# Patient Record
Sex: Male | Born: 1970 | Hispanic: Refuse to answer | Smoking: Current every day smoker
Health system: Southern US, Community
[De-identification: ages and names within clinical notes are randomized; demographics above are authoritative.]

## PROBLEM LIST (undated history)

## (undated) DIAGNOSIS — G8929 Other chronic pain: Secondary | ICD-10-CM

## (undated) DIAGNOSIS — F1120 Opioid dependence, uncomplicated: Secondary | ICD-10-CM

## (undated) DIAGNOSIS — N200 Calculus of kidney: Secondary | ICD-10-CM

## (undated) DIAGNOSIS — L4050 Arthropathic psoriasis, unspecified: Secondary | ICD-10-CM

## (undated) HISTORY — DX: Calculus of kidney: N20.0

---

## 1999-07-25 ENCOUNTER — Emergency Department (HOSPITAL_COMMUNITY): Admission: EM | Admit: 1999-07-25 | Discharge: 1999-07-25 | Payer: Self-pay | Admitting: Emergency Medicine

## 1999-07-26 ENCOUNTER — Encounter: Payer: Self-pay | Admitting: Emergency Medicine

## 1999-07-26 ENCOUNTER — Emergency Department (HOSPITAL_COMMUNITY): Admission: EM | Admit: 1999-07-26 | Discharge: 1999-07-26 | Payer: Self-pay | Admitting: Emergency Medicine

## 2005-06-27 ENCOUNTER — Emergency Department (HOSPITAL_COMMUNITY): Admission: EM | Admit: 2005-06-27 | Discharge: 2005-06-28 | Payer: Self-pay | Admitting: Emergency Medicine

## 2009-06-07 ENCOUNTER — Emergency Department (HOSPITAL_COMMUNITY): Admission: EM | Admit: 2009-06-07 | Discharge: 2009-06-07 | Payer: Self-pay | Admitting: Emergency Medicine

## 2009-08-03 ENCOUNTER — Emergency Department (HOSPITAL_COMMUNITY): Admission: EM | Admit: 2009-08-03 | Discharge: 2009-08-03 | Payer: Self-pay | Admitting: Emergency Medicine

## 2010-03-23 ENCOUNTER — Emergency Department (HOSPITAL_COMMUNITY): Admission: EM | Admit: 2010-03-23 | Discharge: 2010-03-23 | Payer: Self-pay | Admitting: Emergency Medicine

## 2010-03-24 ENCOUNTER — Emergency Department (HOSPITAL_COMMUNITY): Admission: EM | Admit: 2010-03-24 | Discharge: 2010-03-24 | Payer: Self-pay | Admitting: Emergency Medicine

## 2010-05-03 ENCOUNTER — Emergency Department (HOSPITAL_COMMUNITY): Admission: EM | Admit: 2010-05-03 | Discharge: 2010-05-03 | Payer: Self-pay | Admitting: Emergency Medicine

## 2011-03-28 LAB — DIFFERENTIAL
Basophils Absolute: 0 10*3/uL (ref 0.0–0.1)
Basophils Relative: 1 % (ref 0–1)
Eosinophils Absolute: 0.2 10*3/uL (ref 0.0–0.7)
Eosinophils Relative: 2 % (ref 0–5)
Lymphocytes Relative: 24 % (ref 12–46)
Lymphs Abs: 2.3 10*3/uL (ref 0.7–4.0)
Monocytes Absolute: 0.7 10*3/uL (ref 0.1–1.0)
Monocytes Relative: 7 % (ref 3–12)
Neutro Abs: 6.4 10*3/uL (ref 1.7–7.7)
Neutrophils Relative %: 67 % (ref 43–77)

## 2011-03-28 LAB — POCT I-STAT, CHEM 8
BUN: 15 mg/dL (ref 6–23)
Calcium, Ion: 1.29 mmol/L (ref 1.12–1.32)
Chloride: 105 meq/L (ref 96–112)
Creatinine, Ser: 1.2 mg/dL (ref 0.4–1.5)
Glucose, Bld: 108 mg/dL — ABNORMAL HIGH (ref 70–99)
HCT: 50 % (ref 39.0–52.0)
Hemoglobin: 17 g/dL (ref 13.0–17.0)
Potassium: 4.3 meq/L (ref 3.5–5.1)
Sodium: 139 meq/L (ref 135–145)
TCO2: 27 mmol/L (ref 0–100)

## 2011-03-28 LAB — CBC
HCT: 48.3 % (ref 39.0–52.0)
Hemoglobin: 16.3 g/dL (ref 13.0–17.0)
MCHC: 33.7 g/dL (ref 30.0–36.0)
MCV: 91.4 fL (ref 78.0–100.0)
Platelets: 285 10*3/uL (ref 150–400)
RBC: 5.29 MIL/uL (ref 4.22–5.81)
RDW: 13.6 % (ref 11.5–15.5)
WBC: 9.6 10*3/uL (ref 4.0–10.5)

## 2011-04-14 ENCOUNTER — Emergency Department (INDEPENDENT_AMBULATORY_CARE_PROVIDER_SITE_OTHER): Payer: Managed Care, Other (non HMO)

## 2011-04-14 ENCOUNTER — Emergency Department (HOSPITAL_BASED_OUTPATIENT_CLINIC_OR_DEPARTMENT_OTHER)
Admission: EM | Admit: 2011-04-14 | Discharge: 2011-04-14 | Disposition: A | Discharge status: Home / Self Care | Payer: Managed Care, Other (non HMO) | Attending: Emergency Medicine | Admitting: Emergency Medicine

## 2011-04-14 DIAGNOSIS — M545 Low back pain, unspecified: Secondary | ICD-10-CM

## 2011-04-14 DIAGNOSIS — N508 Other specified disorders of male genital organs: Secondary | ICD-10-CM | POA: Insufficient documentation

## 2011-04-14 DIAGNOSIS — L408 Other psoriasis: Secondary | ICD-10-CM | POA: Insufficient documentation

## 2011-04-14 DIAGNOSIS — R51 Headache: Secondary | ICD-10-CM | POA: Insufficient documentation

## 2011-04-14 DIAGNOSIS — N433 Hydrocele, unspecified: Secondary | ICD-10-CM | POA: Insufficient documentation

## 2011-04-14 DIAGNOSIS — M47817 Spondylosis without myelopathy or radiculopathy, lumbosacral region: Secondary | ICD-10-CM | POA: Insufficient documentation

## 2012-03-13 ENCOUNTER — Emergency Department (HOSPITAL_COMMUNITY): Payer: Self-pay

## 2012-03-13 ENCOUNTER — Emergency Department (HOSPITAL_COMMUNITY)
Admission: EM | Admit: 2012-03-13 | Discharge: 2012-03-13 | Disposition: A | Discharge status: Home / Self Care | Payer: Self-pay | Attending: Emergency Medicine | Admitting: Emergency Medicine

## 2012-03-13 ENCOUNTER — Encounter (HOSPITAL_COMMUNITY): Payer: Self-pay | Admitting: *Deleted

## 2012-03-13 DIAGNOSIS — R0682 Tachypnea, not elsewhere classified: Secondary | ICD-10-CM | POA: Insufficient documentation

## 2012-03-13 DIAGNOSIS — R059 Cough, unspecified: Secondary | ICD-10-CM | POA: Insufficient documentation

## 2012-03-13 DIAGNOSIS — R509 Fever, unspecified: Secondary | ICD-10-CM | POA: Insufficient documentation

## 2012-03-13 DIAGNOSIS — IMO0001 Reserved for inherently not codable concepts without codable children: Secondary | ICD-10-CM | POA: Insufficient documentation

## 2012-03-13 DIAGNOSIS — R05 Cough: Secondary | ICD-10-CM | POA: Insufficient documentation

## 2012-03-13 DIAGNOSIS — R0602 Shortness of breath: Secondary | ICD-10-CM | POA: Insufficient documentation

## 2012-03-13 DIAGNOSIS — J189 Pneumonia, unspecified organism: Secondary | ICD-10-CM | POA: Insufficient documentation

## 2012-03-13 DIAGNOSIS — J3489 Other specified disorders of nose and nasal sinuses: Secondary | ICD-10-CM | POA: Insufficient documentation

## 2012-03-13 MED ORDER — AZITHROMYCIN 250 MG PO TABS: 250.0000 mg | ORAL_TABLET | Freq: Every day | ORAL | Status: AC

## 2012-03-13 MED ORDER — HYDROCOD POLST-CHLORPHEN POLST 10-8 MG/5ML PO LQCR: 5.0000 mL | Freq: Two times a day (BID) | ORAL | Status: DC | PRN

## 2012-03-13 MED ORDER — AMOXICILLIN 500 MG PO CAPS: 1000.0000 mg | ORAL_CAPSULE | Freq: Three times a day (TID) | ORAL | Status: AC

## 2012-03-13 MED ORDER — ALBUTEROL SULFATE (5 MG/ML) 0.5% IN NEBU
5.0000 mg | INHALATION_SOLUTION | Freq: Once | RESPIRATORY_TRACT | Status: AC
  Administered 2012-03-13: 5 mg via RESPIRATORY_TRACT
  Filled 2012-03-13: qty 1

## 2012-03-13 MED ORDER — ACETAMINOPHEN 500 MG PO TABS
1000.0000 mg | ORAL_TABLET | Freq: Once | ORAL | Status: AC
  Administered 2012-03-13: 1000 mg via ORAL
  Filled 2012-03-13: qty 2

## 2012-03-13 MED ORDER — ALBUTEROL SULFATE HFA 108 (90 BASE) MCG/ACT IN AERS
2.0000 | INHALATION_SPRAY | Freq: Four times a day (QID) | RESPIRATORY_TRACT | Status: DC
  Administered 2012-03-13 (×2): 2 via RESPIRATORY_TRACT
  Filled 2012-03-13: qty 6.7

## 2012-03-13 NOTE — ED Notes (Signed)
Pt reports his face feels hot and reports having a h/a after breathing tx.  Pt's face is flushed.

## 2012-03-13 NOTE — ED Notes (Signed)
[facility] EDPA notified re pt's complaints after the breathing tx.

## 2012-03-13 NOTE — ED Provider Notes (Signed)
History     CSN: [id number]  Arrival date & time 03/13/12  1058   First MD Initiated Contact with Patient 03/13/12 1109      Chief Complaint  Patient presents with  . Generalized Body Aches  . Cough  . Fever    (Consider location/radiation/quality/duration/timing/severity/associated sxs/prior treatment) HPI Comments: Patient states he has been sick for three days with fever, chills, body aches, shortness of breath, cough, productive of sputum, nasal congestion.  He has been taking 400 mg of ibuprofen without improvement.  Patient has had recent sick contacts when he brought his girlfriend to the emergency department.  Patient is a smoker.  Patient is a 41 y.o. male presenting with cough and fever. The history is provided by the patient.  Cough Pertinent negatives include no sore throat.  Fever Primary symptoms of the febrile illness include fever and cough.    History reviewed. No pertinent past medical history.  History reviewed. No pertinent past surgical history.  No family history on file.  History  Substance Use Topics  . Smoking status: Current Everyday Smoker -- 0.5 packs/day  . Smokeless tobacco: Not on file  . Alcohol Use: Yes     rarely      Review of Systems  Constitutional: Positive for fever.  HENT: Positive for congestion. Negative for sore throat.   Respiratory: Positive for cough.     Allergies  Review of patient's allergies indicates no known allergies.  Home Medications   Current Outpatient Rx  Name Route Sig Dispense Refill  . IBUPROFEN 200 MG PO TABS Oral Take 400 mg by mouth every 6 (six) hours as needed. For pain      BP 137/73  Pulse 99  Temp(Src) 98.7 F (37.1 C) (Oral)  Resp 20  Wt 215 lb (97.523 kg)  SpO2 95%  Physical Exam  Nursing note and vitals reviewed. Constitutional: He is oriented to person, place, and time. He appears well-developed and well-nourished. No distress.  HENT:  Head: Normocephalic and atraumatic.    Neck: Neck supple.  Cardiovascular: Normal rate and regular rhythm.   Pulmonary/Chest: Tachypnea noted. No respiratory distress. He has no decreased breath sounds. He has wheezes. He has no rales.  Abdominal: Soft. There is no tenderness.  Neurological: He is alert and oriented to person, place, and time.  Skin: He is not diaphoretic.  Psychiatric: He has a normal mood and affect. His behavior is normal. Judgment and thought content normal.    ED Course  Procedures (including critical care time)  Labs Reviewed - No data to display Dg Chest 2 View  03/13/2012  *RADIOLOGY REPORT*  Clinical Data: Shortness of breath, cough, fever.  CHEST - 2 VIEW  Comparison: 06/07/2009  Findings: Patchy left infrahilar airspace opacities.  Possible early infiltrate also in the right lung base.  Findings concerning for pneumonia.  Heart is normal size.  No effusions.  No acute bony abnormality.  IMPRESSION: Patchy bilateral lower lobe opacities concerning for pneumonia.  Original Report Authenticated By: Cyndie Chime, M.D.    Patient declines further neb treatment.   1. CAP (community acquired pneumonia)       MDM  Patient with fever, cough, shortness of breath for three days.  Chest x-ray shows patchy bilateral lower lobe pneumonia.  Patient treated with nebulized albuterol and discharged him with Z-Pak and amoxicillin given patient's status is a smoker and bilateral pneumonia, and albuterol inhaler, and Tussionex.  Patient is to return for any worsening condition, including, increased  shortness of breath.  Patient verbalizes understanding and agrees with plan.        Rise Patience, [place] 03/13/12 1421

## 2012-03-13 NOTE — Discharge Instructions (Signed)
Read the information below.  Take the antibiotics as prescribed until they are gone.  Cover your mouth when you cough and wash your hands frequently, do not let anyone eat or drink after you.  Return to the ER immediately if you have worsening shortness of breath.  It is important for your health that you stop smoking. You may return to the ER at any time for worsening condition or any new symptoms that concern you.    Pneumonia, Adult Pneumonia is an infection of the lungs.  CAUSES Pneumonia may be caused by bacteria or a virus. Usually, these infections are caused by breathing infectious particles into the lungs (respiratory tract). SYMPTOMS   Cough.   Fever.   Chest pain.   Increased rate of breathing.   Wheezing.   Mucus production.  DIAGNOSIS  If you have the common symptoms of pneumonia, your caregiver will typically confirm the diagnosis with a chest X-ray. The X-ray will show an abnormality in the lung (pulmonary infiltrate) if you have pneumonia. Other tests of your blood, urine, or sputum may be done to find the specific cause of your pneumonia. Your caregiver may also do tests (blood gases or pulse oximetry) to see how well your lungs are working. TREATMENT  Some forms of pneumonia may be spread to other people when you cough or sneeze. You may be asked to wear a mask before and during your exam. Pneumonia that is caused by bacteria is treated with antibiotic medicine. Pneumonia that is caused by the influenza virus may be treated with an antiviral medicine. Most other viral infections must run their course. These infections will not respond to antibiotics.  PREVENTION A pneumococcal shot (vaccine) is available to prevent a common bacterial cause of pneumonia. This is usually suggested for:  People over 40 years old.   Patients on chemotherapy.   People with chronic lung problems, such as bronchitis or emphysema.   People with immune system problems.  If you are over 65 or  have a high risk condition, you may receive the pneumococcal vaccine if you have not received it before. In some countries, a routine influenza vaccine is also recommended. This vaccine can help prevent some cases of pneumonia.You may be offered the influenza vaccine as part of your care. If you smoke, it is time to quit. You may receive instructions on how to stop smoking. Your caregiver can provide medicines and counseling to help you quit. HOME CARE INSTRUCTIONS   Cough suppressants may be used if you are losing too much rest. However, coughing protects you by clearing your lungs. You should avoid using cough suppressants if you can.   Your caregiver may have prescribed medicine if he or she thinks your pneumonia is caused by a bacteria or influenza. Finish your medicine even if you start to feel better.   Your caregiver may also prescribe an expectorant. This loosens the mucus to be coughed up.   Only take over-the-counter or prescription medicines for pain, discomfort, or fever as directed by your caregiver.   Do not smoke. Smoking is a common cause of bronchitis and can contribute to pneumonia. If you are a smoker and continue to smoke, your cough may last several weeks after your pneumonia has cleared.   A cold steam vaporizer or humidifier in your room or home may help loosen mucus.   Coughing is often worse at night. Sleeping in a semi-upright position in a recliner or using a couple pillows under your head will  help with this.   Get rest as you feel it is needed. Your body will usually let you know when you need to rest.  SEEK IMMEDIATE MEDICAL CARE IF:   Your illness becomes worse. This is especially true if you are elderly or weakened from any other disease.   You cannot control your cough with suppressants and are losing sleep.   You begin coughing up blood.   You develop pain which is getting worse or is uncontrolled with medicines.   You have a fever.   Any of the  symptoms which initially brought you in for treatment are getting worse rather than better.   You develop shortness of breath or chest pain.  MAKE SURE YOU:   Understand these instructions.   Will watch your condition.   Will get help right away if you are not doing well or get worse.  Document Released: 12/05/2005 Document Revised: 11/24/2011 Document Reviewed: 02/24/2011 [facility] Patient Information 2012 [place], [place].

## 2012-03-13 NOTE — ED Notes (Signed)
Pt states "I've been like this since Sunday, coughing, having chills, aching"

## 2012-03-14 NOTE — ED Provider Notes (Signed)
Medical screening examination/treatment/procedure(s) were performed by non-physician practitioner and as supervising physician I was immediately available for consultation/collaboration.   Terriann Difonzo M Margee Trentham, DO 03/14/12 0752 

## 2013-04-18 DIAGNOSIS — Z79899 Other long term (current) drug therapy: Secondary | ICD-10-CM | POA: Insufficient documentation

## 2013-04-18 DIAGNOSIS — N200 Calculus of kidney: Secondary | ICD-10-CM | POA: Insufficient documentation

## 2013-04-18 DIAGNOSIS — F17200 Nicotine dependence, unspecified, uncomplicated: Secondary | ICD-10-CM | POA: Insufficient documentation

## 2013-04-19 ENCOUNTER — Emergency Department (HOSPITAL_COMMUNITY)
Admission: EM | Admit: 2013-04-19 | Discharge: 2013-04-19 | Disposition: A | Discharge status: Home / Self Care | Payer: Self-pay | Attending: Emergency Medicine | Admitting: Emergency Medicine

## 2013-04-19 ENCOUNTER — Encounter (HOSPITAL_COMMUNITY): Payer: Self-pay

## 2013-04-19 ENCOUNTER — Emergency Department (HOSPITAL_COMMUNITY): Payer: Self-pay

## 2013-04-19 DIAGNOSIS — N200 Calculus of kidney: Secondary | ICD-10-CM

## 2013-04-19 LAB — COMPREHENSIVE METABOLIC PANEL
ALT: 32 U/L (ref 0–53)
AST: 23 U/L (ref 0–37)
Albumin: 3.8 g/dL (ref 3.5–5.2)
Alkaline Phosphatase: 65 U/L (ref 39–117)
Calcium: 9.7 mg/dL (ref 8.4–10.5)
GFR calc Af Amer: 83 mL/min — ABNORMAL LOW (ref >90)
Glucose, Bld: 114 mg/dL — ABNORMAL HIGH (ref 70–99)
Potassium: 3.7 mEq/L (ref 3.5–5.1)
Sodium: 136 mEq/L (ref 135–145)
Total Protein: 6.9 g/dL (ref 6.0–8.3)

## 2013-04-19 LAB — URINALYSIS, ROUTINE W REFLEX MICROSCOPIC
Bilirubin Urine: NEGATIVE
Ketones, ur: NEGATIVE mg/dL
Leukocytes, UA: NEGATIVE
Nitrite: NEGATIVE
Urobilinogen, UA: 0.2 mg/dL (ref 0.0–1.0)
pH: 5.5 (ref 5.0–8.0)

## 2013-04-19 LAB — CBC
Hemoglobin: 15.4 g/dL (ref 13.0–17.0)
MCH: 30.4 pg (ref 26.0–34.0)
MCHC: 34.5 g/dL (ref 30.0–36.0)
Platelets: 225 10*3/uL (ref 150–400)
RDW: 12.6 % (ref 11.5–15.5)

## 2013-04-19 LAB — URINE MICROSCOPIC-ADD ON

## 2013-04-19 MED ORDER — IBUPROFEN 800 MG PO TABS: 800.0000 mg | ORAL_TABLET | Freq: Three times a day (TID) | ORAL | Status: DC

## 2013-04-19 MED ORDER — OXYCODONE-ACETAMINOPHEN 5-325 MG PO TABS: 1.0000 | ORAL_TABLET | ORAL | Status: DC | PRN

## 2013-04-19 MED ORDER — KETOROLAC TROMETHAMINE 30 MG/ML IJ SOLN
30.0000 mg | Freq: Once | INTRAMUSCULAR | Status: AC
  Administered 2013-04-19: 30 mg via INTRAVENOUS
  Filled 2013-04-19: qty 1

## 2013-04-19 MED ORDER — TAMSULOSIN HCL 0.4 MG PO CAPS: 0.4000 mg | ORAL_CAPSULE | Freq: Every day | ORAL | Status: DC

## 2013-04-19 NOTE — ED Notes (Signed)
Pt complains of left flank pain that started last Friday then went away, tonight he states that he's had sharp pains in that side again. He states that it hurts to urinate. No blood noted

## 2013-04-19 NOTE — ED Notes (Signed)
Patient transported to CT 

## 2013-04-19 NOTE — ED Notes (Signed)
Pt states that he drove himself here and that he doesn't want any pain medicine.  Pt states he has pain in left side that started at 9 pm and it is very painful,  States he had the same type of pain last Friday but it went away.  Pt is alert and oriented in NAD

## 2013-04-19 NOTE — ED Provider Notes (Signed)
  Medical screening examination/treatment/procedure(s) were performed by non-physician practitioner and as supervising physician I was immediately available for consultation/collaboration.    Gerhard Munch, MD 04/19/13 [id number]

## 2013-04-19 NOTE — ED Provider Notes (Signed)
History     CSN: [id number]  Arrival date & time 04/18/13  2359   First MD Initiated Contact with Patient 04/19/13 0047      Chief Complaint  Patient presents with  . Flank Pain    (Consider location/radiation/quality/duration/timing/severity/associated sxs/prior treatment) HPI Aaron Dixon is a 42 y.o. male who presents to ED with complaint of left flank pain. States first felt pain a week ago, but stats pain subsided and began again today. States pain is severe, sharp, non radiating. Denies nausea, vomiting, diarrhea. Denies prior similar pain. Took ibuprofen at home with no relief. States having some trouble urinating. Denies blood in urine. Denies fever, chills, no changes in bowels.    History reviewed. No pertinent past medical history.  History reviewed. No pertinent past surgical history.  History reviewed. No pertinent family history.  History  Substance Use Topics  . Smoking status: Current Every Day Smoker -- 0.50 packs/day  . Smokeless tobacco: Not on file  . Alcohol Use: Yes     Comment: rarely      Review of Systems  Constitutional: Negative for fever and chills.  HENT: Negative for neck pain and neck stiffness.   Respiratory: Negative.   Cardiovascular: Negative.   Gastrointestinal: Positive for abdominal pain. Negative for nausea, vomiting, diarrhea, constipation and blood in stool.  Genitourinary: Positive for flank pain. Negative for dysuria, urgency and frequency.  Musculoskeletal: Negative.   Skin: Negative.     Allergies  Review of patient's allergies indicates no known allergies.  Home Medications   Current Outpatient Rx  Name  Route  Sig  Dispense  Refill  . ibuprofen (ADVIL,MOTRIN) 200 MG tablet   Oral   Take 400 mg by mouth every 6 (six) hours as needed for pain or headache. For pain         . ibuprofen (ADVIL,MOTRIN) 800 MG tablet   Oral   Take 1 tablet (800 mg total) by mouth 3 (three) times daily.   21 tablet   0   .  oxyCODONE-acetaminophen (PERCOCET) 5-325 MG per tablet   Oral   Take 1 tablet by mouth every 4 (four) hours as needed for pain.   20 tablet   0   . tamsulosin (FLOMAX) 0.4 MG CAPS   Oral   Take 1 capsule (0.4 mg total) by mouth daily.   10 capsule   0     BP 129/80  Pulse 73  Temp(Src) 97.6 F (36.4 C) (Oral)  Resp 16  Wt 215 lb (97.523 kg)  SpO2 99%  Physical Exam  Nursing note and vitals reviewed. Constitutional: He is oriented to person, place, and time. He appears well-developed and well-nourished.  Uncomfortable appearing  Eyes: Conjunctivae are normal.  Neck: Neck supple.  Cardiovascular: Normal rate, regular rhythm and normal heart sounds.   Pulmonary/Chest: Effort normal and breath sounds normal. No respiratory distress. He has no wheezes. He has no rales.  Abdominal: Soft. Bowel sounds are normal. He exhibits no distension. There is no tenderness. There is no rebound.  Left CVA tenderness  Musculoskeletal: He exhibits no edema.  Neurological: He is alert and oriented to person, place, and time.  Skin: Skin is warm and dry.    ED Course  Procedures (including critical care time)  Labs Reviewed  URINALYSIS, ROUTINE W REFLEX MICROSCOPIC - Abnormal; Notable for the following:    APPearance CLOUDY (*)    Hgb urine dipstick LARGE (*)    Protein, ur 30 (*)  All other components within normal limits  CBC - Abnormal; Notable for the following:    WBC 13.9 (*)    All other components within normal limits  COMPREHENSIVE METABOLIC PANEL - Abnormal; Notable for the following:    Glucose, Bld 114 (*)    Total Bilirubin 0.2 (*)    GFR calc non Af Amer 72 (*)    GFR calc Af Amer 83 (*)    All other components within normal limits  LIPASE, BLOOD  URINE MICROSCOPIC-ADD ON   Ct Abdomen Pelvis Wo Contrast  04/19/2013  *RADIOLOGY REPORT*  Clinical Data: Left flank pain for several hours.  Microhematuria. White cell count 14.9.  CT ABDOMEN AND PELVIS WITHOUT CONTRAST   Technique:  Multidetector CT imaging of the abdomen and pelvis was performed following the standard protocol without intravenous contrast.  Comparison: 06/07/2009  Findings: The lung bases are clear.  4 ml stone in the distal left ureter just above the ureterovesicle junction with proximal pyelocaliectasis and ureterectasis as well as periureteral and pararenal stranding.  Changes are consistent with moderate obstruction.  No stones, pyelocaliectasis, or ureterectasis on the right.  No bladder stones or bladder wall thickening.  Diffuse fatty infiltration of the liver.  Contraction of the gallbladder, likely physiologic.  The spleen, pancreas, adrenal glands, abdominal aorta, inferior vena cava, retroperitoneal lymph nodes, and decompressed stomach, small bowel, and colon appear unremarkable for technique.  Moderately prominent visceral adipose tissues.  No free air or free fluid in the abdomen.  Pelvis:  The appendix is normal.  Prostate gland contains small calcification.  No free or loculated pelvic fluid collections.  No evidence of diverticulitis.  No significant pelvic lymphadenopathy. Degenerative changes in the lumbar spine.  IMPRESSION: 4 mm moderately obstructing stone in the distal left ureter.   Original Report Authenticated By: Burman Nieves, M.D.      1. Kidney stone       MDM  Pt with left flank pain. No prior hx of the same. No medical problems. UA with large hgb. CT abd/pelvis obtained and it is positive for a 4mm distal left ureteral stone.  Pt's pain improved with toradol IV. Pt is comfortable to go home. flomax and percocet at home, follow up with urology.   Filed Vitals:   04/19/13 0014 04/19/13 0249  BP: 155/98 129/80  Pulse: 86 73  Temp: 97.6 F (36.4 C)   TempSrc: Oral   Resp: 18 16  Weight: 215 lb (97.523 kg)   SpO2: 97% 99%           Myriam Jacobson Joanny Dupree, PA-C 04/19/13 0559

## 2013-04-28 ENCOUNTER — Ambulatory Visit: Payer: Self-pay | Admitting: Family Medicine

## 2013-04-28 VITALS — BP 116/82 | HR 80 | Temp 98.4°F | Resp 18 | Ht 69.0 in | Wt 221.0 lb

## 2013-04-28 DIAGNOSIS — L409 Psoriasis, unspecified: Secondary | ICD-10-CM | POA: Insufficient documentation

## 2013-04-28 DIAGNOSIS — Z0289 Encounter for other administrative examinations: Secondary | ICD-10-CM

## 2013-04-28 DIAGNOSIS — Z720 Tobacco use: Secondary | ICD-10-CM

## 2013-04-28 NOTE — Patient Instructions (Addendum)
Remember to come back in a month or so so we can get you started on treatment for your psoriasis - especially in your ears - and talk about medicines to help with smoking cessation.

## 2013-04-28 NOTE — Progress Notes (Signed)
Airline pilot Medical Examination   Aaron Dixon is a 42 y.o. male who presents today for a commercial driver fitness determination physical exam. The patient reports no problems. The following portions of the patient's history were reviewed and updated as appropriate: allergies, current medications, past family history, past medical history, past social history, past surgical history and problem list. Review of Systems Pertinent items are noted in HPI.   Objective:    Vision:  Uncorrected Corrected Horizontal Field of Vision  Right Eye 20/25 NA 85 degrees  Left Eye  20/25 na 85 degrees  Both Eyes  20/25 na    Applicant can recognize and distinguish among traffic control signals and devices showing standard red, green, and amber colors.     Monocular Vision?: No   Hearing:   500 Hz 1000 Hz 2000 Hz 4000 Hz  Right Ear  na na na na  Left Ear  na na na na  Can hear forced whisper at 10 feet.    BP 116/82  Pulse 80  Temp(Src) 98.4 F (36.9 C) (Oral)  Resp 18  Ht 5\' 9"  (1.753 m)  Wt 221 lb (100.245 kg)  BMI 32.62 kg/m2  General Appearance:    Alert, cooperative, no distress, appears stated age  Head:    Normocephalic, without obvious abnormality, atraumatic  Eyes:    PERRL, conjunctiva/corneas clear, EOM's intact, fundi    benign, both eyes       Ears:    Normal TM's and external ear canals, both ears  Nose:   Nares normal, septum midline, mucosa normal, no drainage    or sinus tenderness  Throat:   Lips, mucosa, and tongue normal; teeth and gums normal  Neck:   Supple, symmetrical, trachea midline, no adenopathy;       thyroid:  No enlargement/tenderness/nodules; no carotid   bruit or JVD  Back:     Symmetric, no curvature, ROM normal, no CVA tenderness  Lungs:     Clear to auscultation bilaterally, respirations unlabored  Chest wall:    No tenderness or deformity  Heart:    Regular rate and rhythm, S1 and S2 normal, no murmur, rub   or gallop  Abdomen:      Soft, non-tender, bowel sounds active all four quadrants,    no masses, no organomegaly  Genitalia:    Normal male without lesion, discharge or tenderness     Extremities:   Extremities normal, atraumatic, no cyanosis or edema  Pulses:   2+ and symmetric all extremities  Skin:   Skin color, texture, turgor normal other than large psoriatic patches/plaques over extensor surfaces.  Lymph nodes:   Cervical, supraclavicular, and axillary nodes normal  Neurologic:   CNII-XII intact. Normal strength, sensation and reflexes      throughout    Labs: Lab Results  Component Value Date   PROTEINUR 30* 04/19/2013   BILIRUBINUR NEGATIVE 04/19/2013   GLUCOSEU NEGATIVE 04/19/2013      Assessment:    Healthy male exam.  Meets standards in 2 CFR 391.41;  qualifies for 2 year certificate.    Plan:    Medical examiners certificate completed and printed. Return as needed.   Pt w/ psoriasis and smoking.  He plans to RTC later this month when he has private health insurance to address.

## 2013-05-04 ENCOUNTER — Encounter: Payer: Self-pay | Admitting: Family Medicine

## 2014-02-16 ENCOUNTER — Encounter (HOSPITAL_COMMUNITY): Payer: Self-pay | Admitting: Emergency Medicine

## 2014-02-16 ENCOUNTER — Emergency Department (HOSPITAL_COMMUNITY)
Admission: EM | Admit: 2014-02-16 | Discharge: 2014-02-16 | Disposition: A | Discharge status: Home / Self Care | Payer: BC Managed Care – PPO | Attending: Emergency Medicine | Admitting: Emergency Medicine

## 2014-02-16 DIAGNOSIS — L4050 Arthropathic psoriasis, unspecified: Secondary | ICD-10-CM

## 2014-02-16 DIAGNOSIS — Z87442 Personal history of urinary calculi: Secondary | ICD-10-CM | POA: Insufficient documentation

## 2014-02-16 DIAGNOSIS — IMO0001 Reserved for inherently not codable concepts without codable children: Secondary | ICD-10-CM | POA: Insufficient documentation

## 2014-02-16 DIAGNOSIS — F17200 Nicotine dependence, unspecified, uncomplicated: Secondary | ICD-10-CM | POA: Insufficient documentation

## 2014-02-16 MED ORDER — DEXAMETHASONE SODIUM PHOSPHATE 10 MG/ML IJ SOLN
10.0000 mg | Freq: Once | INTRAMUSCULAR | Status: AC
  Administered 2014-02-16: 10 mg via INTRAMUSCULAR
  Filled 2014-02-16: qty 1

## 2014-02-16 MED ORDER — MELOXICAM 15 MG PO TABS: 15.0000 mg | ORAL_TABLET | Freq: Every day | ORAL | Status: DC

## 2014-02-16 MED ORDER — PREDNISONE 10 MG PO TABS: 20.0000 mg | ORAL_TABLET | Freq: Every day | ORAL | Status: DC

## 2014-02-16 NOTE — ED Notes (Signed)
Patient has psoriasis but also complains of pain in his back and all his joints. Hurts 10/10 in the morning and night. During the day it is a 5/10. Has taken motrin and tylenol without relief. Stated he was lifting trees about 2 weeks ago which has his right arm hurting however his joints have been hurting  For a couple of months.

## 2014-02-16 NOTE — ED Provider Notes (Signed)
CSN: [id number]     Arrival date & time 02/16/14  1851 History   First MD Initiated Contact with Patient 02/16/14 2036    This chart was scribed for [name] PA-C, a non-physician practitioner working with Rolland Porter, MD by Lewanda Rife, ED Scribe. This patient was seen in room WTR6/WTR6 and the patient's care was started at 9:24 PM     Chief Complaint  Patient presents with  . Arm Injury   The history is provided by the patient. No language interpreter was used.   HPI Comments: Aaron Dixon is a 43 y.o. male who presents to the Emergency Department with PMHx of psoriasis complaining of persistent upper right arm pain with gradual onset 6 months and worse for the past 2 weeks. Describes pain as moderate in severity. Reports associated chronic generalized arthralgias. Reports change in activity 3 weeks ago he moved some trees that fell in his backyard. Reports symptoms are exacerbated in the morning. Denies any alleviating factors. Denies associated known injury, recent trauma, and fever. Denies taking any medications for psoriasis.   Additionally, reports mild nasal congestion, rhinorrhea, chills, and dry cough onset 2 days. States he is not worried about this complaint and thinks it is a simple cold. Denies associated nausea, emesis, and diarrhea.   Past Medical History  Diagnosis Date  . Kidney stones    History reviewed. No pertinent past surgical history. No family history on file. History  Substance Use Topics  . Smoking status: Current Every Day Smoker -- 0.50 packs/day  . Smokeless tobacco: Not on file  . Alcohol Use: Yes     Comment: rarely    Review of Systems  Constitutional: Negative for fever.  Musculoskeletal: Positive for arthralgias and myalgias.  Skin: Positive for rash.  Psychiatric/Behavioral: Negative for confusion.   A complete 10 system review of systems was obtained and all systems are negative except as noted in the HPI and PMHx.       Allergies  Review of patient's allergies indicates no known allergies.  Home Medications  No current outpatient prescriptions on file. There were no vitals taken for this visit. Physical Exam  Nursing note and vitals reviewed. Constitutional: He is oriented to person, place, and time. He appears well-developed and well-nourished. No distress.  HENT:  Head: Normocephalic and atraumatic.  Eyes: EOM are normal.  Neck: Normal range of motion. Neck supple. No tracheal deviation present.  No cervical midline tenderness.  Cardiovascular: Normal rate and regular rhythm.   No murmur heard. Pulmonary/Chest: Effort normal and breath sounds normal. No respiratory distress. He has no wheezes. He has no rales.  Musculoskeletal: Normal range of motion.  Agent does exhibit tenderness along his upper arm by the bicep and tricep areas. There is no mass. No gross deformities. Some signs of psoriasis his skin otherwise skin unremarkable. Pain has some other joints with movement but full range of motion. No increased warmth to touch.  Neurological: He is alert and oriented to person, place, and time.  Skin: Skin is warm and dry. Rash noted.  Psoriatic rash noted on bilateral elbows and bilateral knees   Psychiatric: He has a normal mood and affect. His behavior is normal.    ED Course  Procedures  COORDINATION OF CARE:  Nursing notes reviewed. Vital signs reviewed. Initial pt interview and examination performed.   9:30 PM-patient seen and evaluated. Patient appears well. No acute distress. Does not appear severely ill or toxic. Doesn't have significant signs of psoriasis.  Complaining of multiple joint pains for the past 6 months. No other concerning or red flag symptoms. Discussed treatment plan with pt at bedside. Pt agrees with plan.    MDM   Final diagnoses:  Psoriatic arthritis    I personally performed the services described in this documentation, which was scribed in my presence.  The recorded information has been reviewed and is accurate.    Angus Sellereter S Quinnlan Abruzzo, PA-C 02/16/14 2144

## 2014-02-16 NOTE — ED Notes (Signed)
Pt from home reports that he thinks he "pulled right bicep" muscle approx 2 weeks ago. Pt has no other c/o and in NAD

## 2014-02-16 NOTE — Discharge Instructions (Signed)
You were seen and evaluated for your joint and extremity pains. At this time your providers to feel this is likely caused a possible psoriatic arthritis. It is strongly recommended that he followup with a primary care provider or rheumatology specialist for continued evaluation and treatment of your condition.     Psoriasis Psoriasis is a common, long-lasting (chronic) inflammation of the skin. It affects both men and women equally, of all ages and all races. Psoriasis cannot be passed from person to person (not contagious). Psoriasis varies from mild to very severe. When severe, it can greatly affect your quality of life. Psoriasis is an inflammatory disorder affecting the skin as well as other organs including the joints (causing an arthritis). With psoriasis, the skin sheds its top layer of cells more rapidly than it does in someone without psoriasis. CAUSES  The cause of psoriasis is largely unknown. Genetics, your immune system, and the environment seem to play a role in causing psoriasis. Factors that can make psoriasis worse include:  Damage or trauma to the skin, such as cuts, scrapes, and sunburn. This damage often causes new areas of psoriasis (lesions).  Winter dryness and lack of sunlight.  Medicines such as lithium, beta-blockers, antimalarial drugs, ACE inhibitors, nonsteroidal anti-inflammatory drugs (ibuprofen, aspirin), and terbinafine. Let your caregiver know if you are taking any of these drugs.  Alcohol. Excessive alcohol use should be avoided if you have psoriasis. Drinking large amounts of alcohol can affect:  How well your psoriasis treatment works.  How safe your psoriasis treatment is.  Smoking. If you smoke, ask your caregiver for help to quit.  Stress.  Bacterial or viral infections.  Arthritis. Arthritis associated with psoriasis (psoriatic arthritis) affects less than 10% of patients with psoriasis. The arthritic intensity does not always match the skin  psoriasis intensity. It is important to let your caregiver know if your joints hurt or if they are stiff. SYMPTOMS  The most common form of psoriasis begins with little red bumps that gradually become larger. The bumps begin to form scales that flake off easily. The lower layers of scales stick together. When these scales are scratched or removed, the underlying skin is tender and bleeds easily. These areas then grow in size and may become large. Psoriasis often creates a rash that looks the same on both sides of the body (symmetrical). It often affects the elbows, knees, groin, genitals, arms, legs, scalp, and nails. Affected nails often have pitting, loosen, thicken, crumble, and are difficult to treat.  "Inverse psoriasis"occurs in the armpits, under breasts, in skin folds, and around the groin, buttocks, and genitals.  "Guttate psoriasis" generally occurs in children and young adults following a recent sore throat (strep throat). It begins with many small, red, scaly spots on the skin. It clears spontaneously in weeks or a few months without treatment. DIAGNOSIS  Psoriasis is diagnosed by physical exam. A tissue sample (biopsy) may also be taken. TREATMENT The treatment of psoriasis depends on your age, health, and living conditions.  Steroid (cortisone) creams, lotions, and ointments may be used. These treatments are associated with thinning of the skin, blood vessels that get larger (dilated), loss of skin pigmentation, and easy bruising. It is important to use these steroids as directed by your caregiver. Only treat the affected areas and not the normal, unaffected skin. People on long-term steroid treatment should wear a medical alert bracelet. Injections may be used in areas that are difficult to treat.  Scalp treatments are available as shampoos, solutions,  sprays, foams, and oils. Avoid scratching the scalp and picking at the scales.  Anthralin medicine works well on areas that are  difficult to treat. However, it stains clothes and skin and may cause temporary irritation.  Synthetic vitamin D (calcipotriene)can be used on small areas. It is available by prescription. The forms of synthetic vitamin D available in health food stores do not help with psoriasis.  Coal tarsare available in various strengths for psoriasis that is difficult to treat. They are one of the longest used treatments for difficult to treat psoriasis. However, they are messy to use.  Light therapy (UV therapy) can be carefully and professionally monitored in a dermatologist's office. Careful sunbathing is helpful for many people as directed by your caregiver. The exposure should be just long enough to cause a mild redness (erythema) of your skin. Avoid sunburn as this may make the condition worse. Sunscreen (SPF of 30 or higher) should be used to protect against sunburn. Cataracts, wrinkles, and skin aging are some of the harmful side effects of light therapy.  If creams (topical medicines) fail, there are several other options for systemic or oral medicines your caregiver can suggest. Psoriasis can sometimes be very difficult to treat. It can come and go. It is necessary to follow up with your caregiver regularly if your psoriasis is difficult to treat. Usually, with persistence you can get a good amount of relief. Maintaining consistent care is important. Do not change caregivers just because you do not see immediate results. It may take several trials to find the right combination of treatment for you. PREVENTING FLARE-UPS  Wear gloves while you wash dishes, while cleaning, and when you are outside in the cold.  If you have radiators, place a bowl of water or damp towel on the radiator. This will help put water back in the air. You can also use a humidifier to keep the air moist. Try to keep the humidity at about 60% in your home.  Apply moisturizer while your skin is still damp from bathing or  showering. This traps water in the skin.  Avoid long, hot baths or showers. Keep soap use to a minimum. Soaps dry out the skin and wash away the protective oils. Use a fragrance free, dye free soap.  Drink enough water and fluids to keep your urine clear or pale yellow. Not drinking enough water depletes your skin's water supply.  Turn off the heat at night and keep it low during the day. Cool air is less drying. SEEK MEDICAL CARE IF:  You have increasing pain in the affected areas.  You have uncontrolled bleeding in the affected areas.  You have increasing redness or warmth in the affected areas.  You start to have pain or stiffness in your joints.  You start feeling depressed about your condition.  You have a fever. Document Released: 12/02/2000 Document Revised: 02/27/2012 Document Reviewed: 05/30/2011 [facility] Patient Information 2014 [place]Care, [place].

## 2014-02-25 NOTE — ED Provider Notes (Signed)
Medical screening examination/treatment/procedure(s) were performed by non-physician practitioner and as supervising physician I was immediately available for consultation/collaboration.   EKG Interpretation None        Eulalia Ellerman, MD 02/25/14 1634 

## 2014-07-20 ENCOUNTER — Encounter (HOSPITAL_COMMUNITY): Payer: Self-pay | Admitting: Emergency Medicine

## 2014-07-20 ENCOUNTER — Emergency Department (HOSPITAL_COMMUNITY)
Admission: EM | Admit: 2014-07-20 | Discharge: 2014-07-20 | Disposition: A | Discharge status: Home / Self Care | Payer: BC Managed Care – PPO | Attending: Emergency Medicine | Admitting: Emergency Medicine

## 2014-07-20 DIAGNOSIS — Z791 Long term (current) use of non-steroidal anti-inflammatories (NSAID): Secondary | ICD-10-CM | POA: Insufficient documentation

## 2014-07-20 DIAGNOSIS — F17200 Nicotine dependence, unspecified, uncomplicated: Secondary | ICD-10-CM | POA: Insufficient documentation

## 2014-07-20 DIAGNOSIS — Z79899 Other long term (current) drug therapy: Secondary | ICD-10-CM | POA: Insufficient documentation

## 2014-07-20 DIAGNOSIS — Z792 Long term (current) use of antibiotics: Secondary | ICD-10-CM | POA: Insufficient documentation

## 2014-07-20 DIAGNOSIS — K089 Disorder of teeth and supporting structures, unspecified: Secondary | ICD-10-CM | POA: Insufficient documentation

## 2014-07-20 DIAGNOSIS — IMO0002 Reserved for concepts with insufficient information to code with codable children: Secondary | ICD-10-CM | POA: Insufficient documentation

## 2014-07-20 DIAGNOSIS — Z87442 Personal history of urinary calculi: Secondary | ICD-10-CM | POA: Insufficient documentation

## 2014-07-20 DIAGNOSIS — K0889 Other specified disorders of teeth and supporting structures: Secondary | ICD-10-CM

## 2014-07-20 MED ORDER — HYDROCODONE-ACETAMINOPHEN 5-325 MG PO TABS: 1.0000 | ORAL_TABLET | Freq: Four times a day (QID) | ORAL | Status: DC | PRN

## 2014-07-20 MED ORDER — IBUPROFEN 600 MG PO TABS: 600.0000 mg | ORAL_TABLET | Freq: Four times a day (QID) | ORAL | Status: DC | PRN

## 2014-07-20 MED ORDER — HYDROCODONE-ACETAMINOPHEN 5-325 MG PO TABS
1.0000 | ORAL_TABLET | Freq: Once | ORAL | Status: AC
  Administered 2014-07-20: 1 via ORAL
  Filled 2014-07-20: qty 1

## 2014-07-20 MED ORDER — PENICILLIN V POTASSIUM 500 MG PO TABS: 500.0000 mg | ORAL_TABLET | Freq: Three times a day (TID) | ORAL | Status: AC

## 2014-07-20 NOTE — ED Notes (Signed)
Pt states he began having upper right dental pain.  Pt states his tooth broke.

## 2014-07-20 NOTE — Discharge Instructions (Signed)
Please call and set-up an appointment with dentist °Please rest and stay hydrated °Please take medications as prescribed rash while on pain medications his be no drinking alcohol, driving, operating any heavy machinery if there is extra please disposer proper manner °Please do not take any extra Tylenol with medication for this can lead, overdose and liver issues °Please continue to monitor symptoms closely and if symptoms are to worsen or change (fever greater than 101, chills, chest pain, shortness of breath, difficulty breathing, numbness, tingling, worsening or changes to pain pattern, swelling to the face, difficulty swallowing, neck pain, neck stiffness, blurred vision, sudden loss of vision, drainage, bleeding to the gumline, trauma) please report back to the ED immediately ° ° °Dental Pain °A tooth ache may be caused by cavities (tooth decay). Cavities expose the nerve of the tooth to air and hot or cold temperatures. It may come from an infection or abscess (also called a boil or furuncle) around your tooth. It is also often caused by dental caries (tooth decay). This causes the pain you are having. °DIAGNOSIS  °Your caregiver can diagnose this problem by exam. °TREATMENT  °· If caused by an infection, it may be treated with medications which kill germs (antibiotics) and pain medications as prescribed by your caregiver. Take medications as directed. °· Only take over-the-counter or prescription medicines for pain, discomfort, or fever as directed by your caregiver. °· Whether the tooth ache today is caused by infection or dental disease, you should see your dentist as soon as possible for further care. °SEEK MEDICAL CARE IF: °The exam and treatment you received today has been provided on an emergency basis only. This is not a substitute for complete medical or dental care. If your problem worsens or new problems (symptoms) appear, and you are unable to meet with your dentist, call or return to this  location. °SEEK IMMEDIATE MEDICAL CARE IF:  °· You have a fever. °· You develop redness and swelling of your face, jaw, or neck. °· You are unable to open your mouth. °· You have severe pain uncontrolled by pain medicine. °MAKE SURE YOU:  °· Understand these instructions. °· Will watch your condition. °· Will get help right away if you are not doing well or get worse. °Document Released: 12/05/2005 Document Revised: 02/27/2012 Document Reviewed: 07/23/2008 °ExitCare® Patient Information ©2015 Exit[employer], LLC. This information is not intended to replace advice given to you by your health care provider. Make sure you discuss any questions you have with your health care provider. ° ° °Emergency Department Resource Guide °1) Find a Doctor and Pay Out of Pocket °Although you won't have to find out who is covered by your insurance plan, it is a good idea to ask around and get recommendations. You will then need to call the office and see if the doctor you have chosen will accept you as a new patient and what types of options they offer for patients who are self-pay. Some doctors offer discounts or will set up payment plans for their patients who do not have insurance, but you will need to ask so you aren't surprised when you get to your appointment. ° °2) Contact Your Local Health Department °Not all health departments have doctors that can see patients for sick visits, but many do, so it is worth a call to see if yours does. If you don't know where your local health department is, you can check in your phone book. The CDC also has a tool to help   you locate your state's health department, and many state websites also have listings of all of their local health departments. ° °3) Find a Walk-in Clinic °If your illness is not likely to be very severe or complicated, you may want to try a walk in clinic. These are popping up all over the country in pharmacies, drugstores, and shopping centers. They're usually staffed by nurse  practitioners or physician assistants that have been trained to treat common illnesses and complaints. They're usually fairly quick and inexpensive. However, if you have serious medical issues or chronic medical problems, these are probably not your best option. ° °No Primary Care Doctor: °- Call [facility] at  832-8000 - they can help you locate a primary care doctor that  accepts your insurance, provides certain services, etc. °- Physician Referral Service- 1-800-533-3463 ° °Chronic Pain Problems: °Organization         Address  Phone   Notes  °[facility]  (336) 297-2271 Patients need to be referred by their primary care doctor.  ° °Medication Assistance: °Organization         Address  Phone   Notes  °[place] [facility] [address] °[place], [place] [address] (336) 641-8030 --Must be a resident of [place] °-- Must have NO insurance coverage whatsoever (no Medicaid/ Medicare, etc.) °-- The pt. MUST have a primary care doctor that directs their care regularly and follows them in the community °  °[facility]  (866) 331-1348   °[employer]  (888) 892-1162   ° °Agencies that provide inexpensive medical care: °Organization         Address  Phone   Notes  °[facility]  (336) 832-8035   °[facility]    (336) 832-7272   °[facility] [address] °[place], [place] [address] (336) 832-4777   °[facility] [address], °[place] (336) 271-4999   °[facility]    (336) 373-0678   °[facility]    (336) 272-1050   °[facility] ° [address], [place] Phone:  (336) 832-4444, Fax:  (336) 832-4440 Hours of Operation:  9 am - 6 pm, M-F.  Also accepts Medicaid/Medicare and self-pay.  °[facility] ° [address], [place] Phone: (336) [phone], Fax: (336) [phone]. Hours of Operation:  8:30 am -  5:30 pm, M-F.  Also accepts Medicaid and self-pay.  °[facility] [place] [address], [place] Phone: (336) 878-6027   °[facility] [address], [place], [place] (336)723-1848, Ext. 123 Mondays & Thursdays: 7-9 AM.  First 15 patients are seen on a first come, first serve basis. °  ° °Medicaid-accepting [place] Providers: ° °Organization         Address  Phone   Notes  °[facility] [address], [place] (336) 641-2100 Also accepts self-pay patients.  °[facility] [address], [place] ° (336) 856-9996   °[facility] [address], [place] (336) 288-8857   °[facility] [address] [place] [address], [place] (336) 299-7000   °Veita Bland [address],   ° (336) 373-1557 Only accepts [place] Access Medicaid patients after they have their name applied to their card.  ° °Self-Pay (no insurance) in [place]: ° °Organization           Address  Phone   Notes  °Sickle Cell Patients, [facility] [address], [place] (336) 832-1970   °[facility] [address], [place] (336) 832-4400   °[facility] [place] ° [address], [place] (336) 992-4800   °[facility]/Dr. Osei-Bonsu ° [address] [place] [address], [place] or [address], [place] (336) [phone] Phone number for both [place] and [place] locations is the same.  °[facility] [address], [place] (336) 299-0000   °[facility] [address] [place] [address], [place] or [address] (336) 852-7530 °(336) 878-2260   °[facility] [address], [place] (336) 350-1642, phone; (336) 294-5005, fax Sees patients 1st and 3rd Saturday of every month.  Must not qualify for public or private insurance (i.e. Medicaid, Medicare, [place] Health Choice,  Veterans' Benefits) • Household income should be no more than 200% of the poverty level •The clinic cannot treat you if you are pregnant or think you are pregnant • Sexually transmitted diseases are not treated at the clinic.  ° ° °Dental Care: °Organization         Address  Phone  Notes  °[facility] [address], [place] (336) 641-6152 Accepts children up to age 21 who are enrolled in Medicaid or [place] Health Choice; pregnant women with a Medicaid card; and children who have applied for Medicaid or [place] Health Choice, but were declined, whose parents can pay a reduced fee at time of service.  °[facility] [place]  [address], [place] (336) 641-7733 Accepts children up to age 21 who are enrolled in Medicaid or [place] Health Choice; pregnant women with a Medicaid card; and children who have applied for Medicaid or [place] Health Choice, but were declined, whose parents can pay a reduced fee at time of service.  °[facility] ° [address], [place] (336) 641-4533 Patients are seen by appointment only. Walk-ins are not accepted. [facility] will see patients 18 years of age and older. °Monday - Tuesday (8am-5pm) °Most Wednesdays (8:30-5pm) °$30 per visit, cash only  °[facility] ° [address], [place] (336) 641-4533 Patients are seen by appointment only. Walk-ins are not accepted. [facility] will see patients 18 years of age and older. °One Wednesday Evening (Monthly: Volunteer Based).  $30 per visit, cash only  °[facility]  (919) 537-3737 for adults; Children under age 4, call Graduate Pediatric Dentistry at ([phone]) [phone]. Children aged 4-14, please call ([phone]) [phone] to request a pediatric application. ° Dental services are provided in all areas of dental care including fillings, crowns and bridges, complete and  partial dentures, implants, gum treatment, root canals, and extractions. Preventive care is also provided. Treatment is provided to both adults and children. °Patients are selected via a lottery and there is often a waiting list. °  °[facility] [address], °[place] ° (336) 763-8833 [email or web address] °  °[facility] [address], [place], [place] (336)723-1848, Ext. 123 Second and Fourth Thursday of each month, opens at 6:30 AM; Clinic ends at 9 AM.  Patients are seen on a first-come first-served basis, and a limited number are seen during each clinic.  ° °[facility] ° [address], [place], [place] (336) 723-7904   Eligibility Requirements °You must have lived in [place], [place], or [place] counties for at least the last three months. °  You cannot be eligible for state or federal sponsored healthcare insurance, including Veterans Administration, Medicaid, or Medicare. °  You generally cannot be eligible for healthcare insurance through your employer.  °  How to apply: °Eligibility screenings are held every Tuesday and Wednesday afternoon from 1:00 pm until 4:00 pm. You do not need an appointment for the interview!  °[facility] [address], [place], [place] 336-631-2330   °[facility]  336-342-8273   °[facility]  336-703-3100   °[facility]  336-570-6415   ° °Behavioral Health Resources in the Community: °Intensive Outpatient Programs °Organization         Address  Phone  Notes  °[facility] [address], [place], [place] 336-878-6098   °[facility] [address], [place], [place] 336-832-9800   °[facility] [address], [place], [place] ° 336-882-2125   °[facility] [address],  °[place], [place] 1-800-853-5163 or 336-641-4981   °Substance Abuse Resources °Organization          Address  Phone  Notes  °[facility]  336-882-2125   °[facility]  336-784-9470   °[facility]  336-285-9073   °[facility]  336-845-3988   °Residential & Outpatient Substance Abuse Program  1-800-659-3381   °Psychological Services °Organization         Address  Phone  Notes  °[facility]  336- 832-9600   °[facility]  336- 378-7881   °[facility] [address], [place] 1-800-853-5163 or 336-641-4981   ° °Mobile Crisis Teams °Organization         Address  Phone  Notes  °[facility], Mobile Crisis Care Unit  1-877-626-1772   °Assertive °[facility] ° [address] [place], [place] 336-834-9664   °Sharon DeEsch [address] °[place] 336-554-5454   ° °Self-Help/Support Groups °Organization         Address  Phone             Notes  °[facility] - variety of support groups  336- 373-1402 Call for more information  °Narcotics Anonymous (NA), [facility] [address], °[place]  2 meetings at this location  ° °Residential Treatment Programs °Organization         Address  Phone  Notes  °[facility] [address],    °[place]  1-866-801-8205   °[facility] ° [address], [place], [place] 704-293-8524   °[facility] [address], [place] 336-845-3988 Admissions: 8am-3pm M-F  °[facility] [address]    °[place], [place] 336-841-1104   °[facility] [address], [place], [place] 336-379-7146   °[facility] [address]  °, [place] 336-285-9073   °[facility] - Intensive Outpatient [address], , [place] 336-852-3033   °[facility] [address]  °[place], [place] 1-877-615-2722 or 336-784-9470   °[facility] [address] [place], [place]  336-227-7417 Accepts Medicaid  °[facility] [address]  °  1-800-659-3381 Substance Abuse/Addiction Treatment  ° °[place] Behavioral Health Resources °Organization           Address  Phone  Notes  °[facility]  (888) 581-9988   °Julie Brannon, PhD [address] [place], [place]   (336) 349-5553 or (336) 951-0000   °[facility]   [address] °[place], [place] (336) 349-4454   °[facility] [address], [place], [place] (336) 342-8316 Insurance/Medicaid/sponsorship through [facility]  °[facility] [address]                                    [place], [place] (336) 342-8316 Therapy/tele-psych/case  °[facility] [address]  ° [place], [place] (336) 349-2233    °Dr. Arfeen  (336) 349-4544   °[facility] [place]  [employer] [place] [facility] 1) [address], [place] °2) [address], [place] °3)  [address], [place] (336) 349-3220 °(336) 342-7768 ° °(336) 342-8140   °[place] [facility] (336) 342-1394 or (336) 342-3537 (After Hours)    ° ° ° °

## 2014-07-20 NOTE — ED Provider Notes (Signed)
CSN: [id number]     Arrival date & time 07/20/14  [id number] History   None    This chart was scribed for non-physician practitioner, Raymon MuttonMarissa Martasia Talamante PA-C working with Glynn OctaveStephen Rancour, MD by Arlan OrganAshley Leger, ED Scribe. This patient was seen in room TR10C/TR10C and the patient's care was started at 7:50 PM.   Chief Complaint  Patient presents with  . Dental Pain   The history is provided by the patient. No language interpreter was used.    HPI Comments: Aaron Dixon is a 43 y.o. male who presents to the Emergency Department complaining of constant, moderate dental pain x 3 days that is progressively worsening. He also reports associates diaphoresis and intermittent HA. Pt sates his tooth broke several weeks ago after play wrestling with his son. Pain is exacerbated with temperature changes. No alleviating factors at this time. He has tried Teacher, English as a foreign languagece application and OTC remedies without any improvement for symptoms. He denies any fever, chills, visual disturbances, dizziness, nausea, SOB, CP, difficulty breathing, difficulty swallowing, or vomiting. No noticeable drainage or bleeding. Pt is currently followed by a dentist but is not scheduled for follow up for another 2 weeks. No known allergies to medications. He has no pertinent past medical history. No other concerns this visit.   Past Medical History  Diagnosis Date  . Kidney stones    History reviewed. No pertinent past surgical history. No family history on file. History  Substance Use Topics  . Smoking status: Current Every Day Smoker -- 0.50 packs/day  . Smokeless tobacco: Not on file  . Alcohol Use: Yes     Comment: rarely    Review of Systems  Constitutional: Positive for diaphoresis. Negative for fever and chills.  HENT: Positive for dental problem. Negative for trouble swallowing.   Respiratory: Negative for shortness of breath.   Cardiovascular: Negative for chest pain.  Gastrointestinal: Negative for nausea and vomiting.   Neurological: Positive for headaches.  Psychiatric/Behavioral: Negative for confusion.      Allergies  Review of patient's allergies indicates no known allergies.  Home Medications   Prior to Admission medications   Medication Sig Start Date End Date Taking? Authorizing Provider  acetaminophen (TYLENOL) 500 MG tablet Take 1,500 mg by mouth every 6 (six) hours as needed for moderate pain or headache.    Historical Provider, MD  Dextromethorphan HBr (VICKS DAYQUIL COUGH) 15 MG/15ML LIQD Take 15 mLs by mouth every 6 (six) hours as needed (cough).    Historical Provider, MD  HYDROcodone-acetaminophen (NORCO/VICODIN) 5-325 MG per tablet Take 1 tablet by mouth every 6 (six) hours as needed for moderate pain or severe pain. 07/20/14   Jaice Lague, PA-C  ibuprofen (ADVIL,MOTRIN) 200 MG tablet Take 400 mg by mouth every 6 (six) hours as needed for headache or moderate pain.    Historical Provider, MD  ibuprofen (ADVIL,MOTRIN) 600 MG tablet Take 1 tablet (600 mg total) by mouth every 6 (six) hours as needed. 07/20/14   Lizania Bouchard, PA-C  meloxicam (MOBIC) 15 MG tablet Take 1 tablet (15 mg total) by mouth daily. 02/16/14   Phill MutterPeter S Dammen, PA-C  penicillin v potassium (VEETID) 500 MG tablet Take 1 tablet (500 mg total) by mouth 3 (three) times daily. 07/20/14 07/27/14  Shayaan Parke, PA-C  predniSONE (DELTASONE) 10 MG tablet Take 2 tablets (20 mg total) by mouth daily. 02/16/14   Angus SellerPeter S Dammen, PA-C   Triage Vitals: BP 141/88  Pulse 80  Temp(Src) 97.9 F (36.6 C) (Oral)  Resp  16  Ht 5\' 10"  (1.778 m)  Wt 200 lb (90.719 kg)  BMI 28.70 kg/m2  SpO2 97%   Physical Exam  Nursing note and vitals reviewed. Constitutional: He is oriented to person, place, and time. He appears well-developed and well-nourished.  HENT:  Head: Normocephalic.  Mouth/Throat: Oropharynx is clear and moist. No oropharyngeal exudate.    Negative active swelling identified to the face. Negative warmth upon palpation.  Negative cellulitic findings. Poor dentition identified with numerous teeth missing as well as numerous teeth diagrammed in decaying process. Right maxillary canine identified to be diagrammed in decaying-negative swelling, erythema, active bleeding or drainage identified to the gumline. Discomfort upon palpation. Negative trismus. Negative sublingual lesions. Uvula midline with symmetrical elevation.  Eyes: Conjunctivae and EOM are normal. Pupils are equal, round, and reactive to light. Right eye exhibits no discharge. Left eye exhibits no discharge.  Neck: Normal range of motion. Neck supple. No tracheal deviation present.  Cardiovascular: Normal rate, regular rhythm and normal heart sounds.  Exam reveals no friction rub.   No murmur heard. Cap refill less than 3 seconds  Pulmonary/Chest: Effort normal and breath sounds normal. No respiratory distress. He has no wheezes. He has no rales.  Patient stable to speak in full sentences without difficulty Negative use of accessory muscles Negative stridor  Musculoskeletal: Normal range of motion.  Full ROM to upper and lower extremities without difficulty noted, negative ataxia noted.  Lymphadenopathy:    He has no cervical adenopathy.  Neurological: He is alert and oriented to person, place, and time.  Cranial nerves III-XII grossly intact Negative facial drooping Negative slurred speech Negative aphasia  Skin: Skin is warm. Rash noted. No erythema.  Psoriasis  Psychiatric: He has a normal mood and affect. His behavior is normal. Thought content normal.    ED Course  Procedures (including critical care time)  DIAGNOSTIC STUDIES: Oxygen Saturation is 97% on RA, Normal by my interpretation.    COORDINATION OF CARE: 7:53 PM- Will start on antibiotic and give a short course of pain medication to help manage symptoms. Discussed treatment plan with pt at bedside and pt agreed to plan.     Labs Review Labs Reviewed - No data to  display  Imaging Review No results found.   EKG Interpretation None      MDM   Final diagnoses:  Pain, dental    Medications  HYDROcodone-acetaminophen (NORCO/VICODIN) 5-325 MG per tablet 1 tablet (1 tablet Oral Given 07/20/14 2004)   Filed Vitals:   07/20/14 1923  BP: 141/88  Pulse: 80  Temp: 97.9 F (36.6 C)  TempSrc: Oral  Resp: 16  Height: 5\' 10"  (1.778 m)  Weight: 200 lb (90.719 kg)  SpO2: 97%    I personally performed the services described in this documentation, which was scribed in my presence. The recorded information has been reviewed and is accurate.  Patient presenting to the ED with dental pain. Negative cellulitic findings. Poor dentition identified with diagrammed tooth in decaying noted. Discomfort identified to the right canine of the maxillary jawline. Negative active drainage or bleeding noted. Negative periapical abscess. Doubt Ludwig's angina. Negative trismus. Doubt peritonsillar abscess. Patient stable, afebrile. Patient not septic appearing. Pain medications given in ED setting. Discharged patient. Discharged patient with pain medications and antibiotics as discussed course, cautions, disposal technique. Referred patient to primary care provider and dentist. Discussed with patient to closely monitor symptoms and if symptoms are to worsen or change to report back to the ED - strict  return instructions given.  Patient agreed to plan of care, understood, all questions answered.   Raymon Mutton, PA-C 07/21/14 1331

## 2014-07-21 NOTE — ED Provider Notes (Signed)
Medical screening examination/treatment/procedure(s) were performed by non-physician practitioner and as supervising physician I was immediately available for consultation/collaboration.   EKG Interpretation None       Briceida Rasberry, MD 07/21/14 1334 

## 2015-01-17 ENCOUNTER — Emergency Department (HOSPITAL_BASED_OUTPATIENT_CLINIC_OR_DEPARTMENT_OTHER): Payer: BLUE CROSS/BLUE SHIELD

## 2015-01-17 ENCOUNTER — Encounter (HOSPITAL_BASED_OUTPATIENT_CLINIC_OR_DEPARTMENT_OTHER): Payer: Self-pay | Admitting: *Deleted

## 2015-01-17 ENCOUNTER — Emergency Department (HOSPITAL_BASED_OUTPATIENT_CLINIC_OR_DEPARTMENT_OTHER)
Admission: EM | Admit: 2015-01-17 | Discharge: 2015-01-17 | Disposition: A | Discharge status: Home / Self Care | Payer: BLUE CROSS/BLUE SHIELD | Attending: Emergency Medicine | Admitting: Emergency Medicine

## 2015-01-17 DIAGNOSIS — Y92321 Football field as the place of occurrence of the external cause: Secondary | ICD-10-CM | POA: Insufficient documentation

## 2015-01-17 DIAGNOSIS — W1839XA Other fall on same level, initial encounter: Secondary | ICD-10-CM | POA: Diagnosis not present

## 2015-01-17 DIAGNOSIS — Z872 Personal history of diseases of the skin and subcutaneous tissue: Secondary | ICD-10-CM | POA: Diagnosis not present

## 2015-01-17 DIAGNOSIS — Z720 Tobacco use: Secondary | ICD-10-CM | POA: Diagnosis not present

## 2015-01-17 DIAGNOSIS — Z79899 Other long term (current) drug therapy: Secondary | ICD-10-CM | POA: Diagnosis not present

## 2015-01-17 DIAGNOSIS — Y9361 Activity, american tackle football: Secondary | ICD-10-CM | POA: Diagnosis not present

## 2015-01-17 DIAGNOSIS — S43101A Unspecified dislocation of right acromioclavicular joint, initial encounter: Secondary | ICD-10-CM

## 2015-01-17 DIAGNOSIS — Z87442 Personal history of urinary calculi: Secondary | ICD-10-CM | POA: Diagnosis not present

## 2015-01-17 DIAGNOSIS — Z7952 Long term (current) use of systemic steroids: Secondary | ICD-10-CM | POA: Insufficient documentation

## 2015-01-17 DIAGNOSIS — S4991XA Unspecified injury of right shoulder and upper arm, initial encounter: Secondary | ICD-10-CM | POA: Diagnosis present

## 2015-01-17 DIAGNOSIS — Y998 Other external cause status: Secondary | ICD-10-CM | POA: Diagnosis not present

## 2015-01-17 DIAGNOSIS — R52 Pain, unspecified: Secondary | ICD-10-CM

## 2015-01-17 HISTORY — DX: Arthropathic psoriasis, unspecified: L40.50

## 2015-01-17 MED ORDER — MORPHINE SULFATE 4 MG/ML IJ SOLN
4.0000 mg | INTRAMUSCULAR | Status: DC | PRN
  Administered 2015-01-17: 4 mg via INTRAVENOUS
  Filled 2015-01-17: qty 1

## 2015-01-17 MED ORDER — OXYCODONE-ACETAMINOPHEN 5-325 MG PO TABS: ORAL_TABLET | ORAL | Status: DC

## 2015-01-17 NOTE — ED Provider Notes (Signed)
CSN: [id number]     Arrival date & time 01/17/15  1919 History   First MD Initiated Contact with Patient 01/17/15 1932     Chief Complaint  Patient presents with  . Shoulder Pain     (Consider location/radiation/quality/duration/timing/severity/associated sxs/prior Treatment) HPI   Aaron Dixon is a 44 y.o. male complaining of severe, 10 out of 10 right shoulder pain after patient fell on the shoulder while playing football just prior to arrival. He states that his range of motion is reduced. No pain medication taken prior to arrival. He denies other trauma including head trauma, cervicalgia, chest pain, shortness of breath, difficulty moving the elbow or wrist or fingers. Patient is right-hand-dominant. States that he does not want Norco because they don't do anything for him, states that the last time he was written a prescription for Norco he threw them in the trash can if he was written for a prescription for Norco today he will throw that in the trash.  Past Medical History  Diagnosis Date  . Kidney stones   . Psoriatic arthritis    History reviewed. No pertinent past surgical history. No family history on file. History  Substance Use Topics  . Smoking status: Current Every Day Smoker -- 0.50 packs/day    Types: Cigarettes  . Smokeless tobacco: Not on file  . Alcohol Use: Yes     Comment: rarely    Review of Systems  10 systems reviewed and found to be negative, except as noted in the HPI.   Allergies  Review of patient's allergies indicates no known allergies.  Home Medications   Prior to Admission medications   Medication Sig Start Date End Date Taking? Authorizing Provider  Adalimumab (HUMIRA Diagonal) Inject into the skin.   Yes Historical Provider, MD  acetaminophen (TYLENOL) 500 MG tablet Take 1,500 mg by mouth every 6 (six) hours as needed for moderate pain or headache.    Historical Provider, MD  Dextromethorphan HBr (VICKS DAYQUIL COUGH) 15 MG/15ML LIQD Take 15  mLs by mouth every 6 (six) hours as needed (cough).    Historical Provider, MD  HYDROcodone-acetaminophen (NORCO/VICODIN) 5-325 MG per tablet Take 1 tablet by mouth every 6 (six) hours as needed for moderate pain or severe pain. 07/20/14   Marissa Sciacca, PA-C  ibuprofen (ADVIL,MOTRIN) 200 MG tablet Take 400 mg by mouth every 6 (six) hours as needed for headache or moderate pain.    Historical Provider, MD  ibuprofen (ADVIL,MOTRIN) 600 MG tablet Take 1 tablet (600 mg total) by mouth every 6 (six) hours as needed. 07/20/14   Marissa Sciacca, PA-C  meloxicam (MOBIC) 15 MG tablet Take 1 tablet (15 mg total) by mouth daily. 02/16/14   Angus Seller, PA-C  oxyCODONE-acetaminophen (PERCOCET/ROXICET) 5-325 MG per tablet 1 to 2 tabs PO q6hrs  PRN for pain 01/17/15   Joni Reining Jaxn Chiquito, PA-C  predniSONE (DELTASONE) 10 MG tablet Take 2 tablets (20 mg total) by mouth daily. 02/16/14   Phill Mutter Dammen, PA-C   BP 142/79 mmHg  Pulse 83  Temp(Src) 98.4 F (36.9 C) (Oral)  Resp 18  Ht  (1.753 m)  Wt 210 lb (95.255 kg)  BMI 31.00 kg/m2  SpO2 96% Physical Exam  Constitutional: He is oriented to person, place, and time. He appears well-developed and well-nourished. No distress.  HENT:  Head: Normocephalic and atraumatic.  Mouth/Throat: Oropharynx is clear and moist.  Eyes: Conjunctivae and EOM are normal. Pupils are equal, round, and reactive to light.  Neck: Normal range of motion.  Cardiovascular: Normal rate, regular rhythm and intact distal pulses.   Pulmonary/Chest: Effort normal and breath sounds normal. No stridor. No respiratory distress. He has no wheezes. He has no rales. He exhibits no tenderness.  Abdominal: Soft. Bowel sounds are normal. He exhibits no distension and no mass. There is no tenderness. There is no rebound and no guarding.  Musculoskeletal: Normal range of motion. He exhibits tenderness.  No significant deformity. Patient cannot abduct shoulder secondary to pain. No tenderness to  palpation along the midline C-spine, elbow or right wrist. No tenderness palpation along the rotator cuff musculature. Patient is exquisitely tender at the before meals joint.  Neurological: He is alert and oriented to person, place, and time.  Psychiatric: He has a normal mood and affect.  Nursing note and vitals reviewed.   ED Course  Procedures (including critical care time) Labs Review Labs Reviewed - No data to display  Imaging Review Dg Shoulder Right  01/17/2015   CLINICAL DATA:  Status post fall today while playing football. Right shoulder pain and limited range of motion.  EXAM: RIGHT SHOULDER - 2+ VIEW  COMPARISON:  Plain films of the right shoulder 04/14/2011.  FINDINGS: The distal clavicle is elevated relative to the acromion, new since the prior examination. The humerus is located. No fracture is identified.  IMPRESSION: Findings compatible with grade 3 AC joint separation. Negative for fracture.   Electronically Signed   By: Drusilla Kannerhomas  Dalessio M.D.   On: 01/17/2015 19:58     EKG Interpretation None      MDM   Final diagnoses:  Pain  Acromioclavicular joint separation, type 3, right, initial encounter    Filed Vitals:   01/17/15 1926  BP: 142/79  Pulse: 83  Temp: 98.4 F (36.9 C)  TempSrc: Oral  Resp: 18  Height: 5\' 9"  (1.753 m)  Weight: 210 lb (95.255 kg)  SpO2: 96%    Medications  morphine 4 MG/ML injection 4 mg (4 mg Intravenous Given 01/17/15 2030)    Aaron Dixon is a pleasant 44 y.o. male presenting with severe pain to right shoulder after patient fell on the shoulder while playing football earlier in the day. Patient is distally neurovascularly intact, no other signs of trauma or other injuries. X-ray reveals grade 3 acromioclavicular joint separation which is the area of tenderness. Patient [place] up on the [place] controlled substance database, he does have 2 prescriptions for Vicodin last 6 months.   Evaluation does not show pathology that  would require ongoing emergent intervention or inpatient treatment. Pt is hemodynamically stable and mentating appropriately. Discussed findings and plan with patient/guardian, who agrees with care plan. All questions answered. Return precautions discussed and outpatient follow up given.   New Prescriptions   OXYCODONE-ACETAMINOPHEN (PERCOCET/ROXICET) 5-325 MG PER TABLET    1 to 2 tabs PO q6hrs  PRN for pain         Wynetta Emeryicole Patrik Turnbaugh, PA-C 01/17/15 2039  Wynetta Emeryicole Adriana Lina, PA-C 01/17/15 2131  Wynetta EmeryNicole Nathaly Dawkins, PA-C 01/17/15 2132  Merrie RoofJohn David Wofford III, MD 01/17/15 [phone]

## 2015-01-17 NOTE — ED Notes (Signed)
Pt playing football today- fell on right shoulder- c/o pain and limited mobilty

## 2015-01-17 NOTE — Discharge Instructions (Signed)
Take percocet for breakthrough pain, do not drink alcohol, drive, care for children or do other critical tasks while taking percocet.  Please follow with your primary care doctor in the next 2 days for a check-up. They must obtain records for further management.   Do not hesitate to return to the Emergency Department for any new, worsening or concerning symptoms.     Acromioclavicular Injuries The AC (acromioclavicular) joint is the joint in the shoulder where the collarbone (clavicle) meets the shoulder blade (scapula). The part of the shoulder blade connected to the collarbone is called the acromion. Common problems with and treatments for the [facility] joint are detailed below. ARTHRITIS Arthritis occurs when the joint has been injured and the smooth padding between the joints (cartilage) is lost. This is the wear and tear seen in most joints of the body if they have been overused. This causes the joint to produce pain and swelling which is worse with activity.  AC JOINT SEPARATION AC joint separation means that the ligaments connecting the acromion of the shoulder blade and collarbone have been damaged, and the two bones no longer line up. AC separations can be anywhere from mild to severe, and are "graded" depending upon which ligaments are torn and how badly they are torn.  Grade I Injury: the least damage is done, and the [facility] still lines up.  Grade II Injury: damage to the ligaments which reinforce the [facility] joint. In a Grade II injury, these ligaments are stretched but not entirely torn. When stressed, the [facility] joint becomes painful and unstable.  Grade III Injury: AC and secondary ligaments are completely torn, and the collarbone is no longer attached to the shoulder blade. This results in deformity; a prominence of the end of the clavicle. AC JOINT FRACTURE AC joint fracture means that there has been a break in the bones of the [facility] joint, usually the end of the clavicle. TREATMENT TREATMENT  OF AC ARTHRITIS  There is currently no way to replace the cartilage damaged by arthritis. The best way to improve the condition is to decrease the activities which aggravate the problem. Application of ice to the joint helps decrease pain and soreness (inflammation). The use of non-steroidal anti-inflammatory medication is helpful.  If less conservative measures do not work, then cortisone shots (injections) may be used. These are anti-inflammatories; they decrease the soreness in the joint and swelling.  If non-surgical measures fail, surgery may be recommended. The procedure is generally removal of a portion of the end of the clavicle. This is the part of the collarbone closest to your acromion which is stabilized with ligaments to the acromion of the shoulder blade. This surgery may be performed using a tube-like instrument with a light (arthroscope) for looking into a joint. It may also be performed as an open surgery through a small incision by the surgeon. Most patients will have good range of motion within 6 weeks and may return to all activity including sports by 8-12 weeks, barring complications. TREATMENT OF AN AC SEPARATION  The initial treatment is to decrease pain. This is best accomplished by immobilizing the arm in a sling and placing an ice pack to the shoulder for 20 to 30 minutes every 2 hours as needed. As the pain starts to subside, it is important to begin moving the fingers, wrist, elbow and eventually the shoulder in order to prevent a stiff or "frozen" shoulder. Instruction on when and how much to move the shoulder will be provided  by your caregiver. The length of time needed to regain full motion and function depends on the amount or grade of the injury. Recovery from a Grade I AC separation usually takes 10 to 14 days, whereas a Grade III may take 6 to 8 weeks.  Grade I and II separations usually do not require surgery. Even Grade III injuries usually allow return to full  activity with few restrictions. Treatment is also based on the activity demands of the injured shoulder. For example, a high level quarterback with an injured throwing arm will receive more aggressive treatment than someone with a desk job who rarely uses his/her arm for strenuous activities. In some cases, a painful lump may persist which could require a later surgery. Surgery can be very successful, but the benefits must be weighed against the potential risks. TREATMENT OF AN AC JOINT FRACTURE Fracture treatment depends on the type of fracture. Sometimes a splint or sling may be all that is required. Other times surgery may be required for repair. This is more frequently the case when the ligaments supporting the clavicle are completely torn. Your caregiver will help you with these decisions and together you can decide what will be the best treatment. HOME CARE INSTRUCTIONS   Apply ice to the injury for 15-20 minutes each hour while awake for 2 days. Put the ice in a plastic bag and place a towel between the bag of ice and skin.  If a sling has been applied, wear it constantly for as long as directed by your caregiver, even at night. The sling or splint can be removed for bathing or showering or as directed. Be sure to keep the shoulder in the same place as when the sling is on. Do not lift the arm.  If a figure-of-eight splint has been applied it should be tightened gently by another person every day. Tighten it enough to keep the shoulders held back. Allow enough room to place the index finger between the body and strap. Loosen the splint immediately if there is numbness or tingling in the hands.  Take over-the-counter or prescription medicines for pain, discomfort or fever as directed by your caregiver.  If you or your child has received a follow up appointment, it is very important to keep that appointment in order to avoid long term complications, chronic pain or disability. SEEK MEDICAL CARE  IF:   The pain is not relieved with medications.  There is increased swelling or discoloration that continues to get worse rather than better.  You or your child has been unable to follow up as instructed.  There is progressive numbness and tingling in the arm, forearm or hand. SEEK IMMEDIATE MEDICAL CARE IF:   The arm is numb, cold or pale.  There is increasing pain in the hand, forearm or fingers. MAKE SURE YOU:   Understand these instructions.  Will watch your condition.  Will get help right away if you are not doing well or get worse. Document Released: 09/14/2005 Document Revised: 02/27/2012 Document Reviewed: 03/09/2009 [facility] Patient Information 2015 [place], [place]. This information is not intended to replace advice given to you by your health care provider. Make sure you discuss any questions you have with your health care provider.

## 2015-01-19 ENCOUNTER — Ambulatory Visit (INDEPENDENT_AMBULATORY_CARE_PROVIDER_SITE_OTHER): Payer: BLUE CROSS/BLUE SHIELD | Admitting: Family Medicine

## 2015-01-19 ENCOUNTER — Encounter: Payer: Self-pay | Admitting: Family Medicine

## 2015-01-19 VITALS — BP 124/83 | HR 73 | Ht 69.0 in | Wt 210.0 lb

## 2015-01-19 DIAGNOSIS — S4991XA Unspecified injury of right shoulder and upper arm, initial encounter: Secondary | ICD-10-CM

## 2015-01-19 DIAGNOSIS — S4351XA Sprain of right acromioclavicular joint, initial encounter: Secondary | ICD-10-CM

## 2015-01-19 MED ORDER — OXYCODONE-ACETAMINOPHEN 7.5-325 MG PO TABS: 1.0000 | ORAL_TABLET | Freq: Four times a day (QID) | ORAL | Status: DC | PRN

## 2015-01-19 NOTE — Patient Instructions (Signed)
You have a Grade 3 AC sprain (shoulder separation). Take ibuprofen 800mg  three times a day with food OR aleve 2 tabs twice a day with food for pain and inflammation. Percocet as needed for severe pain (no driving on this medication) - try to stretch this out until you return in 2 weeks. Icing as needed. Sling regularly for support. When you feel able, start arm circles, pendulums, and table slides - 3 sets of 10 (i would wait at least a week before starting these). Follow up with me in 2 weeks. Out of work in the meantime.

## 2015-01-21 DIAGNOSIS — S4991XA Unspecified injury of right shoulder and upper arm, initial encounter: Secondary | ICD-10-CM | POA: Insufficient documentation

## 2015-01-21 NOTE — Progress Notes (Signed)
PCP: Obie DredgeHAWKES,ANGELA D, MD  Subjective:   HPI: Patient is a 44 y.o. male here for right shoulder injury.  Patient reports he was playing football with his kids on 1/30. He tried to catch the ball, tripped, fell directly onto right shoulder. Immediate pain, difficulty using right shoulder. No prior injuries. + swelling. Motion limited. Is right handed. Taking percocet from ED.  Past Medical History  Diagnosis Date  . Kidney stones   . Psoriatic arthritis     Current Outpatient Prescriptions on File Prior to Visit  Medication Sig Dispense Refill  . Adalimumab (HUMIRA ) Inject into the skin.     No current facility-administered medications on file prior to visit.    No past surgical history on file.  No Known Allergies  History   Social History  . Marital Status: Legally Separated    Spouse Name: N/A    Number of Children: N/A  . Years of Education: N/A   Occupational History  . Not on file.   Social History Main Topics  . Smoking status: Current Every Day Smoker -- 1.00 packs/day    Types: Cigarettes  . Smokeless tobacco: Not on file  . Alcohol Use: 0.0 oz/week    0 Not specified per week     Comment: rarely  . Drug Use: No  . Sexual Activity: Not on file   Other Topics Concern  . Not on file   Social History Narrative    No family history on file.  BP 124/83 mmHg  Pulse 73  Ht 5\' 9"  (1.753 m)  Wt 210 lb (95.255 kg)  BMI 31.00 kg/m2  Review of Systems: See HPI above.    Objective:  Physical Exam:  Gen: NAD  Right shoulder: Localized swelling but no bruising over AC joint. [facility], around this area only. Full ext rotation and int rotation.  Abduction and flexion limited to 80 degrees, painful. Pain with hawkins. Negative Yergasons. Strength 5/5 resisted internal/external rotation.  [facility]. NV intact distally.    Assessment & Plan:  1. Right shoulder injury - radiographs confirm a grade 3 AC  separation.  Discussed operative vs nonoperative intervention - recommended trial of conservative measures for at least 6 weeks first.  NSAIDs with percocet, icing as needed.  Sling for support.  Out of work.  [place] exercises reviewed.  F/u in 2 weeks.

## 2015-01-21 NOTE — Assessment & Plan Note (Signed)
radiographs confirm a grade 3 AC separation.  Discussed operative vs nonoperative intervention - recommended trial of conservative measures for at least 6 weeks first.  NSAIDs with percocet, icing as needed.  Sling for support.  Out of work.  Codman exercises reviewed.  F/u in 2 weeks.

## 2015-01-29 ENCOUNTER — Telehealth: Payer: Self-pay | Admitting: Family Medicine

## 2015-01-29 NOTE — Telephone Encounter (Signed)
He was prescribed enough to last 15 days, a very high dose of percocet.  If he did want tramadol we could provide that as it does not have tylenol in it.  Otherwise would not refill pain medication until his follow up ~2/15.

## 2015-01-29 NOTE — Telephone Encounter (Signed)
Spoke to patient and told him that we could not refill the percocet.  patient did not want Tramadol. Told the patient to keep appointment on 2-15.

## 2015-02-02 ENCOUNTER — Ambulatory Visit: Payer: BLUE CROSS/BLUE SHIELD | Admitting: Family Medicine

## 2015-02-02 ENCOUNTER — Ambulatory Visit (INDEPENDENT_AMBULATORY_CARE_PROVIDER_SITE_OTHER): Payer: BLUE CROSS/BLUE SHIELD | Admitting: Family Medicine

## 2015-02-02 ENCOUNTER — Encounter: Payer: Self-pay | Admitting: Family Medicine

## 2015-02-02 VITALS — BP 123/82 | HR 71 | Ht 69.0 in | Wt 210.0 lb

## 2015-02-02 DIAGNOSIS — S4991XD Unspecified injury of right shoulder and upper arm, subsequent encounter: Secondary | ICD-10-CM

## 2015-02-02 MED ORDER — OXYCODONE-ACETAMINOPHEN 10-325 MG PO TABS: 1.0000 | ORAL_TABLET | Freq: Four times a day (QID) | ORAL | Status: DC | PRN

## 2015-02-02 NOTE — Patient Instructions (Signed)
You have a Grade 3 AC sprain (shoulder separation). Take ibuprofen 800mg  three times a day with food OR aleve 2 tabs twice a day with food for pain and inflammation. Percocet as needed for severe pain (no driving on this medication) - try to stretch this out until you return in 2 weeks. Icing as needed. Sling regularly for support. When you feel able, start arm circles, pendulums, and table slides - 3 sets of 10 (i would wait at least a week before starting these). Follow up with me in 2 weeks. Out of work in the meantime.

## 2015-02-03 NOTE — Progress Notes (Signed)
PCP: Obie DredgeHAWKES,ANGELA D, MD  Subjective:   HPI: Patient is a 44 y.o. male here for right shoulder injury.  2/1: Patient reports he was playing football with his kids on 1/30. He tried to catch the ball, tripped, fell directly onto right shoulder. Immediate pain, difficulty using right shoulder. No prior injuries. + swelling. Motion limited. Is right handed. Taking percocet from ED.  2/15: Patient reports mornings are very difficult, painful. Seems better as day goes on. Pain currently 8/10. Motion limited. Localized swelling. Took more pain medication than prescribed - had discussed risks of liver damage.   Past Medical History  Diagnosis Date  . Kidney stones   . Psoriatic arthritis     Current Outpatient Prescriptions on File Prior to Visit  Medication Sig Dispense Refill  . Adalimumab (HUMIRA [place]) Inject into the skin.     No current facility-administered medications on file prior to visit.    No past surgical history on file.  No Known Allergies  History   Social History  . Marital Status: Legally Separated    Spouse Name: N/A  . Number of Children: N/A  . Years of Education: N/A   Occupational History  . Not on file.   Social History Main Topics  . Smoking status: Current Every Day Smoker -- 1.00 packs/day    Types: Cigarettes  . Smokeless tobacco: Not on file  . Alcohol Use: 0.0 oz/week    0 Standard drinks or equivalent per week     Comment: rarely  . Drug Use: No  . Sexual Activity: Not on file   Other Topics Concern  . Not on file   Social History Narrative    No family history on file.  BP 123/82 mmHg  Pulse 71  Ht 5\' 9"  (1.753 m)  Wt 210 lb (95.255 kg)  BMI 31.00 kg/m2  Review of Systems: See HPI above.    Objective:  Physical Exam:  Gen: NAD  Right shoulder: Localized swelling but no bruising over AC joint. TTP at [facility] joint, around this area only. Full ext rotation and int rotation.  Abduction and flexion limited to 80  degrees, painful. Pain with hawkins. Strength 5/5 resisted internal/external rotation.  [facility]. NV intact distally.    Assessment & Plan:  1. Right shoulder injury - radiographs confirm a grade 3 AC separation.  Mild improvement compared to last visit.  NSAIDs with percocet as needed - again emphasized not to take more than prescribed.  Start codman exercises.  Out of work.  F/u in 2 weeks - hopefully can start therapy and/or strengthening at that time.  Sling for support.

## 2015-02-03 NOTE — Assessment & Plan Note (Signed)
radiographs confirm a grade 3 AC separation.  Mild improvement compared to last visit.  NSAIDs with percocet as needed - again emphasized not to take more than prescribed.  Start codman exercises.  Out of work.  F/u in 2 weeks - hopefully can start therapy and/or strengthening at that time.  Sling for support.

## 2015-02-16 ENCOUNTER — Ambulatory Visit (INDEPENDENT_AMBULATORY_CARE_PROVIDER_SITE_OTHER): Payer: BLUE CROSS/BLUE SHIELD | Admitting: Family Medicine

## 2015-02-16 ENCOUNTER — Encounter (INDEPENDENT_AMBULATORY_CARE_PROVIDER_SITE_OTHER): Payer: Self-pay

## 2015-02-16 ENCOUNTER — Encounter: Payer: Self-pay | Admitting: Family Medicine

## 2015-02-16 VITALS — BP 135/95 | HR 78 | Ht 70.0 in | Wt 200.0 lb

## 2015-02-16 DIAGNOSIS — S4991XD Unspecified injury of right shoulder and upper arm, subsequent encounter: Secondary | ICD-10-CM

## 2015-02-16 MED ORDER — OXYCODONE-ACETAMINOPHEN 10-325 MG PO TABS: 1.0000 | ORAL_TABLET | Freq: Four times a day (QID) | ORAL | Status: DC | PRN

## 2015-02-16 NOTE — Patient Instructions (Signed)
You have a Grade 3 AC sprain (shoulder separation). Take ibuprofen 800mg  three times a day with food OR aleve 2 tabs twice a day with food for pain and inflammation. Percocet as needed for severe pain (no driving on this medication). Icing as needed. Sling regularly for support. Try not to use the arm except when doing your home exercises. When you feel able, start arm circles, pendulums, and table slides - 3 sets of 10 (i would wait at least a week before starting these). Follow up with me in 2 weeks. Out of work in the meantime. If not improving at that point I would recommend consulting with an orthopedic surgeon.

## 2015-02-17 NOTE — Assessment & Plan Note (Signed)
radiographs confirm a grade 3 AC separation.  Mild improvement compared to last visit though overall still struggling.  NSAIDs with percocet as needed.  Continue codman exercises.  Out of work.  F/u in 2 weeks - if still not improving advised would refer to orthopedics to consider surgery.  Sling for support.

## 2015-02-17 NOTE — Progress Notes (Signed)
PCP: Obie DredgeHAWKES,ANGELA D, MD  Subjective:   HPI: Patient is a 44 y.o. male here for right shoulder injury.  2/1: Patient reports he was playing football with his kids on 1/30. He tried to catch the ball, tripped, fell directly onto right shoulder. Immediate pain, difficulty using right shoulder. No prior injuries. + swelling. Motion limited. Is right handed. Taking percocet from ED.  2/15: Patient reports mornings are very difficult, painful. Seems better as day goes on. Pain currently 8/10. Motion limited. Localized swelling. Took more pain medication than prescribed - had discussed risks of liver damage.   2/29: Patient reports he continues to struggle with pain with mild improvement to 7/10 level of pain. Motion some improved - better with pain medication. Still with swelling. Using ice. Doing motion exercises.  Past Medical History  Diagnosis Date  . Kidney stones   . Psoriatic arthritis     Current Outpatient Prescriptions on File Prior to Visit  Medication Sig Dispense Refill  . Adalimumab (HUMIRA [place]) Inject into the skin.     No current facility-administered medications on file prior to visit.    No past surgical history on file.  No Known Allergies  History   Social History  . Marital Status: Legally Separated    Spouse Name: N/A  . Number of Children: N/A  . Years of Education: N/A   Occupational History  . Not on file.   Social History Main Topics  . Smoking status: Current Every Day Smoker -- 1.00 packs/day    Types: Cigarettes  . Smokeless tobacco: Not on file  . Alcohol Use: 0.0 oz/week    0 Standard drinks or equivalent per week     Comment: rarely  . Drug Use: No  . Sexual Activity: Not on file   Other Topics Concern  . Not on file   Social History Narrative    No family history on file.  BP 135/95 mmHg  Pulse 78  Ht 5\' 10"  (1.778 m)  Wt 200 lb (90.719 kg)  BMI 28.70 kg/m2  Review of Systems: See HPI above.     Objective:  Physical Exam:  Gen: NAD  Right shoulder: Localized swelling but no bruising over AC joint. [facility] around this area only. Full ext rotation and int rotation.  Abduction and flexion limited to 80 degrees, painful. Pain with hawkins. Strength 5/5 resisted internal/external rotation.  Did not test empty can due to pain. NV intact distally.    Assessment & Plan:  1. Right shoulder injury - radiographs confirm a grade 3 AC separation.  Mild improvement compared to last visit though overall still struggling.  NSAIDs with percocet as needed.  Continue codman exercises.  Out of work.  F/u in 2 weeks - if still not improving advised would refer to orthopedics to consider surgery.  Sling for support.

## 2015-02-24 ENCOUNTER — Encounter: Payer: Self-pay | Admitting: Family Medicine

## 2015-02-24 ENCOUNTER — Ambulatory Visit (INDEPENDENT_AMBULATORY_CARE_PROVIDER_SITE_OTHER): Payer: BLUE CROSS/BLUE SHIELD | Admitting: Family Medicine

## 2015-02-24 VITALS — BP 129/81 | HR 71 | Ht 70.0 in | Wt 200.0 lb

## 2015-02-24 DIAGNOSIS — S4991XD Unspecified injury of right shoulder and upper arm, subsequent encounter: Secondary | ICD-10-CM

## 2015-02-24 NOTE — Patient Instructions (Addendum)
You have a Grade 3 AC sprain (shoulder separation). Take aleve 2 tabs twice a day with food for pain and inflammation. Tylenol 500mg  1-2 tabs three times a day. Remember the pain medicine (percocet) has tylenol in it - don't take tylenol when you're taking the percocet. Icing as needed. Ok to return to work on Monday. The family doctor I would recommend is Dibas Koirala at [facility].  Tell him I said hello if you decide to establish care there. Follow up with me as needed.

## 2015-02-26 NOTE — Progress Notes (Signed)
PCP: Obie DredgeHAWKES,ANGELA D, MD  Subjective:   HPI: Patient is a 44 y.o. male here for right shoulder injury.  2/1: Patient reports he was playing football with his kids on 1/30. He tried to catch the ball, tripped, fell directly onto right shoulder. Immediate pain, difficulty using right shoulder. No prior injuries. + swelling. Motion limited. Is right handed. Taking percocet from ED.  2/15: Patient reports mornings are very difficult, painful. Seems better as day goes on. Pain currently 8/10. Motion limited. Localized swelling. Took more pain medication than prescribed - had discussed risks of liver damage.   2/29: Patient reports he continues to struggle with pain with mild improvement to 7/10 level of pain. Motion some improved - better with pain medication. Still with swelling. Using ice. Doing motion exercises.  3/8: Patient reports he feels much improved over the past week. Pain level has come down to 2/10. Motion has improved and strength as well. Feels like he's ready to return to work. Takes percocet as needed. Icing as needed.  Past Medical History  Diagnosis Date  . Kidney stones   . Psoriatic arthritis     Current Outpatient Prescriptions on File Prior to Visit  Medication Sig Dispense Refill  . Adalimumab (HUMIRA [place]) Inject into the skin.    Marland Kitchen. oxyCODONE-acetaminophen (PERCOCET) 10-325 MG per tablet Take 1 tablet by mouth every 6 (six) hours as needed for pain. 60 tablet 0   No current facility-administered medications on file prior to visit.    No past surgical history on file.  No Known Allergies  History   Social History  . Marital Status: Legally Separated    Spouse Name: N/A  . Number of Children: N/A  . Years of Education: N/A   Occupational History  . Not on file.   Social History Main Topics  . Smoking status: Current Every Day Smoker -- 1.00 packs/day    Types: Cigarettes  . Smokeless tobacco: Not on file  . Alcohol Use: 0.0  oz/week    0 Standard drinks or equivalent per week     Comment: rarely  . Drug Use: No  . Sexual Activity: Not on file   Other Topics Concern  . Not on file   Social History Narrative    No family history on file.  BP 129/81 mmHg  Pulse 71  Ht 5\' 10"  (1.778 m)  Wt 200 lb (90.719 kg)  BMI 28.70 kg/m2  Review of Systems: See HPI above.    Objective:  Physical Exam:  Gen: NAD  Right shoulder: Localized swelling but no bruising over AC joint. [facility], around this area only. FROM with mild pain at full abduction and flexion. Pain with hawkins. Strength 5/5 resisted internal/external rotation and empty can. NV intact distally.    Assessment & Plan:  1. Right shoulder injury - radiographs confirm a grade 3 AC separation.  Clinically much better and meets parameters to return to work (full motion, pain < 3/10, full strength compared to left).  I would like him to give this a few more days of rest however - he can return on 3/14 when his new work week begins.  Tylenol, aleve, icing.  Can take percocet in evenings.  Call us with any concerns.  Otherwise f/u prn.

## 2015-02-26 NOTE — Assessment & Plan Note (Signed)
radiographs confirm a grade 3 AC separation.  Clinically much better and meets parameters to return to work (full motion, pain < 3/10, full strength compared to left).  I would like him to give this a few more days of rest however - he can return on 3/14 when his new work week begins.  Tylenol, aleve, icing.  Can take percocet in evenings.  Call us with any concerns.  Otherwise f/u prn.

## 2015-05-25 ENCOUNTER — Ambulatory Visit (INDEPENDENT_AMBULATORY_CARE_PROVIDER_SITE_OTHER): Payer: BLUE CROSS/BLUE SHIELD | Admitting: Family Medicine

## 2015-05-25 ENCOUNTER — Encounter (INDEPENDENT_AMBULATORY_CARE_PROVIDER_SITE_OTHER): Payer: Self-pay

## 2015-05-25 ENCOUNTER — Ambulatory Visit (HOSPITAL_BASED_OUTPATIENT_CLINIC_OR_DEPARTMENT_OTHER)
Admission: RE | Admit: 2015-05-25 | Discharge: 2015-05-25 | Disposition: A | Discharge status: Home / Self Care | Payer: BLUE CROSS/BLUE SHIELD | Source: Ambulatory Visit | Attending: Family Medicine | Admitting: Family Medicine

## 2015-05-25 ENCOUNTER — Encounter: Payer: Self-pay | Admitting: Family Medicine

## 2015-05-25 VITALS — BP 124/83 | HR 80 | Ht 70.0 in | Wt 205.0 lb

## 2015-05-25 DIAGNOSIS — M25511 Pain in right shoulder: Secondary | ICD-10-CM | POA: Diagnosis present

## 2015-05-25 DIAGNOSIS — S3992XA Unspecified injury of lower back, initial encounter: Secondary | ICD-10-CM

## 2015-05-25 DIAGNOSIS — S43109A Unspecified dislocation of unspecified acromioclavicular joint, initial encounter: Secondary | ICD-10-CM | POA: Insufficient documentation

## 2015-05-25 DIAGNOSIS — W19XXXA Unspecified fall, initial encounter: Secondary | ICD-10-CM | POA: Insufficient documentation

## 2015-05-25 DIAGNOSIS — S4991XD Unspecified injury of right shoulder and upper arm, subsequent encounter: Secondary | ICD-10-CM | POA: Diagnosis not present

## 2015-05-25 MED ORDER — METHOCARBAMOL 500 MG PO TABS: 500.0000 mg | ORAL_TABLET | Freq: Three times a day (TID) | ORAL | Status: DC | PRN

## 2015-05-25 MED ORDER — OXYCODONE-ACETAMINOPHEN 5-325 MG PO TABS: 1.0000 | ORAL_TABLET | Freq: Four times a day (QID) | ORAL | Status: DC | PRN

## 2015-05-25 MED ORDER — PREDNISONE 10 MG PO TABS: ORAL_TABLET | ORAL | Status: DC

## 2015-05-25 NOTE — Patient Instructions (Addendum)
You have a lumbar strain (a pinched nerve in your low back). A prednisone dose pack is the best option for immediate relief and may be prescribed. Day after finishing prednisone it's ok to take aleve or ibuprofen with food for pain and inflammation. Percocet as needed for severe pain (no driving on this medicine). Robaxin as needed for muscle spasms (no driving on this medicine if it makes you sleepy). Stay as active as possible. Physical therapy has been shown to be helpful as well - will consider if you're improving over 1-2 weeks (call me if you want to do this before I see you for follow-up). Strengthening of low back muscles, abdominal musculature are key for long term pain relief. If not improving, will consider further imaging (MRI).  You flared your shoulder AC sprain up again. Sling only if needed. Icing 15 minutes at a time 3-4 times a day. Follow up with me in 2 weeks for both issues.

## 2015-05-26 DIAGNOSIS — S3992XA Unspecified injury of lower back, initial encounter: Secondary | ICD-10-CM | POA: Insufficient documentation

## 2015-05-26 NOTE — Assessment & Plan Note (Signed)
reinjury to grade 3 AC separation.  No new fracture on radiographs.  I'm concerned as he didn't strike this and there was no significant injury leading to this that he may require surgery to return full function to this shoulder.  We will trial conservative treatment - icing, sling as needed.  Robaxin, percocet as needed.

## 2015-05-26 NOTE — Progress Notes (Signed)
PCP: Obie DredgeHAWKES,ANGELA D, MD  Subjective:   HPI: Patient is a 44 y.o. male here for right shoulder injury and back pain.  2/1: Patient reports he was playing football with his kids on 1/30. He tried to catch the ball, tripped, fell directly onto right shoulder. Immediate pain, difficulty using right shoulder. No prior injuries. + swelling. Motion limited. Is right handed. Taking percocet from ED.  2/15: Patient reports mornings are very difficult, painful. Seems better as day goes on. Pain currently 8/10. Motion limited. Localized swelling. Took more pain medication than prescribed - had discussed risks of liver damage.   2/29: Patient reports he continues to struggle with pain with mild improvement to 7/10 level of pain. Motion some improved - better with pain medication. Still with swelling. Using ice. Doing motion exercises.  3/8: Patient reports he feels much improved over the past week. Pain level has come down to 2/10. Motion has improved and strength as well. Feels like he's ready to return to work. Takes percocet as needed. Icing as needed.  6/6: Patient returns with new injury to right shoulder and low back. He states he went to the beach and on Saturday 6/4 He was holding something and tripped on sand going forward - caught himself and didn't fall. Felt like his low back in middle pulled and felt right shoulder pulled in same area as prior injury. He has had some intermittent issues with right shoulder since last visit and hasn't gone back to heavy lifting.  Past Medical History  Diagnosis Date  . Kidney stones   . Psoriatic arthritis     Current Outpatient Prescriptions on File Prior to Visit  Medication Sig Dispense Refill  . Adalimumab (HUMIRA [employer]) Inject into the skin.     No current facility-administered medications on file prior to visit.    No past surgical history on file.  No Known Allergies  History   Social History  . Marital Status:  Legally Separated    Spouse Name: N/A  . Number of Children: N/A  . Years of Education: N/A   Occupational History  . Not on file.   Social History Main Topics  . Smoking status: Current Every Day Smoker -- 1.00 packs/day    Types: Cigarettes  . Smokeless tobacco: Not on file  . Alcohol Use: 0.0 oz/week    0 Standard drinks or equivalent per week     Comment: rarely  . Drug Use: No  . Sexual Activity: Not on file   Other Topics Concern  . Not on file   Social History Narrative    No family history on file.  BP 124/83 mmHg  Pulse 80  Ht 5\' 10"  (1.778 m)  Wt 205 lb (92.987 kg)  BMI 29.41 kg/m2  Review of Systems: See HPI above.    Objective:  Physical Exam:  Gen: NAD  Right shoulder: Localized swelling but no bruising over AC joint. [facility]. Only 45 degrees abduction, flexion but full IR/ER. Strength 5/5 resisted internal/external rotation. NV intact distally.    Back: No gross deformity, scoliosis. TTP paraspinal lumbar regions.  No midline or bony TTP. FROM. Strength LEs 5/5 all muscle groups.   2+ MSRs in patellar and achilles tendons, equal bilaterally. Negative SLRs. Sensation intact to light touch bilaterally. Negative logroll bilateral hips  Assessment & Plan:  1. Right shoulder injury - reinjury to grade 3 AC separation.  No new fracture on radiographs.  I'm concerned as he didn't strike this  and there was no significant injury leading to this that he may require surgery to return full function to this shoulder.  We will trial conservative treatment - icing, sling as needed.  Robaxin, percocet as needed.    2. Low back injury - consistent with lumbar strain.  Prednisone for severe strain, transition to ibuprofen. Percocet, robaxin as needed.  Call us in 1-2 weeks - add PT if improving, MRI if not improving.

## 2015-06-08 ENCOUNTER — Encounter: Payer: Self-pay | Admitting: Family Medicine

## 2015-06-08 ENCOUNTER — Ambulatory Visit (INDEPENDENT_AMBULATORY_CARE_PROVIDER_SITE_OTHER): Payer: BLUE CROSS/BLUE SHIELD | Admitting: Family Medicine

## 2015-06-08 VITALS — BP 120/88 | HR 67 | Ht 69.0 in | Wt 205.0 lb

## 2015-06-08 DIAGNOSIS — M25511 Pain in right shoulder: Secondary | ICD-10-CM

## 2015-06-08 DIAGNOSIS — S4991XD Unspecified injury of right shoulder and upper arm, subsequent encounter: Secondary | ICD-10-CM

## 2015-06-08 MED ORDER — CYCLOBENZAPRINE HCL 10 MG PO TABS: 10.0000 mg | ORAL_TABLET | Freq: Three times a day (TID) | ORAL | Status: DC | PRN

## 2015-06-08 MED ORDER — HYDROCODONE-ACETAMINOPHEN 7.5-325 MG PO TABS: 1.0000 | ORAL_TABLET | Freq: Four times a day (QID) | ORAL | Status: DC | PRN

## 2015-06-08 NOTE — Patient Instructions (Addendum)
We will go ahead with an MRI of your shoulder and refer you to a surgeon. This will assess if you also have a rotator cuff tear in addition to the shoulder separation. Norco as needed for severe pain (no driving on this medicine). Flexeril as needed for muscle spasms (no driving on this medicine if it makes you sleepy). Stay as active as possible. We could always start physical therapy for your low back sooner if you want.

## 2015-06-09 NOTE — Assessment & Plan Note (Signed)
reinjury to grade 3 AC separation though also worried about possible concurrent rotator cuff tear.  Advised we go ahead with MRI to assess.  Regardless will refer to orthopedics for probable surgical intervention of his Grade 3 AC separation.

## 2015-06-09 NOTE — Progress Notes (Signed)
PCP: Obie Dredge, MD  Subjective:   HPI: Patient is a 44 y.o. male here for right shoulder injury and back pain.  2/1: Patient reports he was playing football with his kids on 1/30. He tried to catch the ball, tripped, fell directly onto right shoulder. Immediate pain, difficulty using right shoulder. No prior injuries. + swelling. Motion limited. Is right handed. Taking percocet from ED.  2/15: Patient reports mornings are very difficult, painful. Seems better as day goes on. Pain currently 8/10. Motion limited. Localized swelling. Took more pain medication than prescribed - had discussed risks of liver damage.   2/29: Patient reports he continues to struggle with pain with mild improvement to 7/10 level of pain. Motion some improved - better with pain medication. Still with swelling. Using ice. Doing motion exercises.  3/8: Patient reports he feels much improved over the past week. Pain level has come down to 2/10. Motion has improved and strength as well. Feels like he's ready to return to work. Takes percocet as needed. Icing as needed.  6/6: Patient returns with new injury to right shoulder and low back. He states he went to the beach and on Saturday 6/4 He was holding something and tripped on sand going forward - caught himself and didn't fall. Felt like his low back in middle pulled and felt right shoulder pulled in same area as prior injury. He has had some intermittent issues with right shoulder since last visit and hasn't gone back to heavy lifting.  6/20: Patient struggling with right shoulder pain. Pain level 8/10 - difficulty moving this. Still not much progress from last visit. Icing, taking percocet as needed.  Past Medical History  Diagnosis Date  . Kidney stones   . Psoriatic arthritis     Current Outpatient Prescriptions on File Prior to Visit  Medication Sig Dispense Refill  . Adalimumab (HUMIRA [place]) Inject into the skin.     No  current facility-administered medications on file prior to visit.    No past surgical history on file.  No Known Allergies  History   Social History  . Marital Status: Legally Separated    Spouse Name: N/A  . Number of Children: N/A  . Years of Education: N/A   Occupational History  . Not on file.   Social History Main Topics  . Smoking status: Current Every Day Smoker -- 1.00 packs/day    Types: Cigarettes  . Smokeless tobacco: Not on file  . Alcohol Use: 0.0 oz/week    0 Standard drinks or equivalent per week     Comment: rarely  . Drug Use: No  . Sexual Activity: Not on file   Other Topics Concern  . Not on file   Social History Narrative    No family history on file.  BP 120/88 mmHg  Pulse 67  Ht  (1.753 m)  Wt 205 lb (92.987 kg)  BMI 30.26 kg/m2  Review of Systems: See HPI above.    Objective:  Physical Exam:  Gen: NAD  Right shoulder: Localized swelling but no bruising over AC joint. TTP at [facility] joint. Only 45 degrees abduction, flexion but full IR/ER. Strength 5/5 resisted internal/external rotation.  Unable to position for empty can. NV intact distally.    Assessment & Plan:  1. Right shoulder injury - reinjury to grade 3 AC separation though also worried about possible concurrent rotator cuff tear.  Advised we go ahead with MRI to assess.  Regardless will refer to orthopedics for probable  surgical intervention of his Grade 3 AC separation.

## 2015-06-15 ENCOUNTER — Other Ambulatory Visit: Payer: BLUE CROSS/BLUE SHIELD

## 2015-06-23 ENCOUNTER — Telehealth: Payer: Self-pay | Admitting: Family Medicine

## 2015-06-23 ENCOUNTER — Ambulatory Visit (INDEPENDENT_AMBULATORY_CARE_PROVIDER_SITE_OTHER): Payer: BLUE CROSS/BLUE SHIELD

## 2015-06-23 DIAGNOSIS — S4991XD Unspecified injury of right shoulder and upper arm, subsequent encounter: Secondary | ICD-10-CM | POA: Diagnosis not present

## 2015-06-23 DIAGNOSIS — M7551 Bursitis of right shoulder: Secondary | ICD-10-CM

## 2015-06-23 DIAGNOSIS — X58XXXD Exposure to other specified factors, subsequent encounter: Secondary | ICD-10-CM

## 2015-06-23 DIAGNOSIS — M25511 Pain in right shoulder: Secondary | ICD-10-CM

## 2015-06-23 NOTE — Addendum Note (Signed)
Addended by: Kathi SimpersWISE, Bharat Antillon F on: 06/23/2015 02:52 PM   Modules accepted: Orders

## 2015-06-23 NOTE — Telephone Encounter (Signed)
Spoke with patient about MRI results - Grade 3 separation as expected though he also has a SLAP tear of about 25% labrum.  Will refer to orthopedics.  Advised prior prescription is the last one we would give him - was to get him through his MRI to ortho appointment.  Patient verbalized understanding.

## 2015-10-02 ENCOUNTER — Inpatient Hospital Stay (HOSPITAL_COMMUNITY): Admission: RE | Admit: 2015-10-02 | Payer: BLUE CROSS/BLUE SHIELD | Source: Ambulatory Visit

## 2015-10-08 NOTE — H&P (Signed)
  Cory Munchimothy D Bushey is an 44 y.o. male.    Chief Complaint: right shoulder pain  HPI: Pt is a 44 y.o. male complaining of right shoulder pain for multiple years. Pain had continually increased since the beginning. X-rays in the clinic show right shoulder separation. Pt has tried various conservative treatments which have failed to alleviate their symptoms. Various options are discussed with the patient. Risks, benefits and expectations were discussed with the patient. Patient understand the risks, benefits and expectations and wishes to proceed with surgery.   PCP:  Obie DredgeAngela D Hawkes, MD  D/C Plans: Home  PMH: Past Medical History  Diagnosis Date  . Kidney stones   . Psoriatic arthritis     PSH: No past surgical history on file.  Social History:  reports that he has been smoking Cigarettes.  He has been smoking about 1.00 pack per day. He does not have any smokeless tobacco history on file. He reports that he drinks alcohol. He reports that he does not use illicit drugs.  Allergies:  No Known Allergies  Medications: No current facility-administered medications for this encounter.   Current Outpatient Prescriptions  Medication Sig Dispense Refill  . Adalimumab (HUMIRA [place]) Inject into the skin.    . cyclobenzaprine (FLEXERIL) 10 MG tablet Take 1 tablet (10 mg total) by mouth every 8 (eight) hours as needed. 60 tablet 1  . HYDROcodone-acetaminophen (NORCO) 7.5-325 MG per tablet Take 1 tablet by mouth every 6 (six) hours as needed for moderate pain. 60 tablet 0    No results found for this or any previous visit (from the past 48 hour(s)). No results found.  ROS: Pain with rom of the right upper extremity  Physical Exam:  Alert and oriented 44 y.o. male in no acute distress Cranial nerves 2-12 intact Cervical spine: full rom with no tenderness, nv intact distally Chest: active breath sounds bilaterally, no wheeze rhonchi or rales Heart: regular rate and rhythm, no murmur Abd:  non tender non distended with active bowel sounds Hip is stable with rom  Right shoulder with obvious superior elevation of distal clavicle Pain with rom of right shoulder nv intact distally No rashes or edema  Assessment/Plan Assessment: right shoulder separation  Plan: Patient will undergo a right shoulder scope and coracoclaviclar [facility] Risks benefits and expectations were discussed with the patient. Patient understand risks, benefits and expectations and wishes to proceed.

## 2015-10-14 ENCOUNTER — Inpatient Hospital Stay (HOSPITAL_COMMUNITY)
Admission: RE | Admit: 2015-10-14 | Discharge: 2015-10-14 | Disposition: A | Discharge status: Home / Self Care | Payer: BLUE CROSS/BLUE SHIELD | Source: Ambulatory Visit

## 2015-10-14 NOTE — Progress Notes (Signed)
Attempted to obtain pre-op info over the phone, pt states we could call him any time tomorrow, he is not able to talk at this time.

## 2015-10-14 NOTE — Pre-Procedure Instructions (Signed)
    Aaron Dixon  10/14/2015      [id number] - [place], [place] - [address] [place] [address] Phone: [phone] Fax: [phone]  [facility] [address] - [place], [place] - [id number] [place] [address] Phone: [phone] Fax: [phone]  [facility] [address] - [place], [place] - [id number] [place] [id number] Phone: [phone] Fax: [phone]  [facility] [facility] - [place] - [address] [place] [address] Phone: [phone] Fax: [phone]    Your procedure is scheduled on Friday, October 23, 2015  [facility] Ad[facility] at call [phone] at 8:00 A.M. on day of surgery for arrival time  Call this number if you have problems the morning of surgery:  [phone]   Remember:  Do not eat food or drink liquids after midnight Thursday, October 22, 2015  Take these medicines the morning of surgery with A SIP OF WATER : if needed: HYDROcodone-acetaminophen (NORCO) for pain.  Stop taking Aspirin,vitamins and herbal medications, fish oil . Do not take any NSAIDs ie: Ibuprofen, Advil, Naproxen or any medication containing Aspirin; stop Friday October 16, 2015  Do not wear jewelry, make-up or nail polish.  Do not wear lotions, powders, or perfumes.  You may not wear deodorant.  Do not shave 48 hours prior to surgery.  Men may shave face and neck.  Do not bring valuables to the hospital.  [facility] is not responsible for any belongings or valuables.  Contacts, dentures or bridgework may not be worn into surgery.  Leave your suitcase in the car.  After surgery it may be brought to your room.  For patients admitted to the hospital, discharge time will be determined by your treatment team.  Patients discharged the day of surgery will not be  allowed to drive home.   Special instructions: Shower the night before surgery and the morning of surgery with CHG.  Please read over the following fact sheets that you were given. Pain Booklet, Coughing and Deep Breathing and Surgical Site Infection Prevention

## 2015-10-14 NOTE — Progress Notes (Signed)
Pt not here for his pat appointment, called pt he states he was in an accident can he as been in the hospital ans will not be able to make it today.

## 2015-10-22 MED ORDER — CHLORHEXIDINE GLUCONATE 4 % EX LIQD: 60.0000 mL | Freq: Once | CUTANEOUS | Status: DC

## 2015-10-22 MED ORDER — CEFAZOLIN SODIUM-DEXTROSE 2-3 GM-% IV SOLR: 2.0000 g | INTRAVENOUS | Status: DC

## 2015-10-22 NOTE — Progress Notes (Signed)
I spoke with Mr Aaron Dixon who  said he has been in an accident and he can not have surgery. I instructed patient to call Dr Ranell PatrickNorris office and tell them.  I called and left a message with Dr  Ranell PatrickNorris' scheduler ( on voice mail).

## 2015-10-23 ENCOUNTER — Ambulatory Visit (HOSPITAL_COMMUNITY)
Admission: RE | Admit: 2015-10-23 | Payer: BLUE CROSS/BLUE SHIELD | Source: Ambulatory Visit | Admitting: Orthopedic Surgery

## 2015-10-23 ENCOUNTER — Encounter (HOSPITAL_COMMUNITY): Admission: RE | Payer: BLUE CROSS/BLUE SHIELD | Source: Ambulatory Visit

## 2015-10-23 SURGERY — SHOULDER ARTHROSCOPY WITH SUBACROMIAL DECOMPRESSION AND OPEN ROTATOR CUFF REPAIR, OPEN BICEPS TENDON REPAIR
Anesthesia: Regional | Site: Shoulder | Laterality: Right

## 2017-03-14 ENCOUNTER — Ambulatory Visit (INDEPENDENT_AMBULATORY_CARE_PROVIDER_SITE_OTHER): Payer: Self-pay

## 2017-03-14 ENCOUNTER — Other Ambulatory Visit: Payer: Self-pay | Admitting: Emergency Medicine

## 2017-03-14 DIAGNOSIS — Z021 Encounter for pre-employment examination: Secondary | ICD-10-CM

## 2017-08-30 ENCOUNTER — Other Ambulatory Visit: Payer: Self-pay | Admitting: Emergency Medicine

## 2017-08-30 ENCOUNTER — Ambulatory Visit (INDEPENDENT_AMBULATORY_CARE_PROVIDER_SITE_OTHER): Payer: Self-pay

## 2017-08-30 DIAGNOSIS — M545 Low back pain: Secondary | ICD-10-CM

## 2017-08-30 DIAGNOSIS — M1288 Other specific arthropathies, not elsewhere classified, other specified site: Secondary | ICD-10-CM

## 2017-09-03 ENCOUNTER — Emergency Department (HOSPITAL_COMMUNITY)
Admission: EM | Admit: 2017-09-03 | Discharge: 2017-09-03 | Disposition: A | Discharge status: Home / Self Care | Payer: BLUE CROSS/BLUE SHIELD | Attending: Emergency Medicine | Admitting: Emergency Medicine

## 2017-09-03 ENCOUNTER — Emergency Department (HOSPITAL_COMMUNITY): Payer: BLUE CROSS/BLUE SHIELD

## 2017-09-03 ENCOUNTER — Encounter (HOSPITAL_COMMUNITY): Payer: Self-pay

## 2017-09-03 DIAGNOSIS — Z87442 Personal history of urinary calculi: Secondary | ICD-10-CM | POA: Diagnosis not present

## 2017-09-03 DIAGNOSIS — M545 Low back pain, unspecified: Secondary | ICD-10-CM

## 2017-09-03 DIAGNOSIS — F17210 Nicotine dependence, cigarettes, uncomplicated: Secondary | ICD-10-CM | POA: Insufficient documentation

## 2017-09-03 MED ORDER — KETOROLAC TROMETHAMINE 30 MG/ML IJ SOLN
30.0000 mg | Freq: Once | INTRAMUSCULAR | Status: AC
  Administered 2017-09-03: 30 mg via INTRAMUSCULAR
  Filled 2017-09-03: qty 1

## 2017-09-03 MED ORDER — PREDNISONE 20 MG PO TABS: 20.0000 mg | ORAL_TABLET | Freq: Every day | ORAL | 0 refills | Status: AC

## 2017-09-03 MED ORDER — DIAZEPAM 5 MG PO TABS: 5.0000 mg | ORAL_TABLET | Freq: Three times a day (TID) | ORAL | 0 refills | Status: DC | PRN

## 2017-09-03 MED ORDER — IBUPROFEN 800 MG PO TABS: 800.0000 mg | ORAL_TABLET | Freq: Three times a day (TID) | ORAL | 0 refills | Status: DC | PRN

## 2017-09-03 MED ORDER — PREDNISONE 50 MG PO TABS
60.0000 mg | ORAL_TABLET | Freq: Once | ORAL | Status: AC
  Administered 2017-09-03: 60 mg via ORAL
  Filled 2017-09-03: qty 1

## 2017-09-03 MED ORDER — DIAZEPAM 5 MG PO TABS
5.0000 mg | ORAL_TABLET | Freq: Once | ORAL | Status: AC
  Administered 2017-09-03: 5 mg via ORAL
  Filled 2017-09-03: qty 1

## 2017-09-03 NOTE — Discharge Instructions (Signed)
You have been seen in the Emergency Department (ED)  today for back pain.  Your workup and exam have not shown any acute abnormalities and you are likely suffering from muscle strain or possible problems with your discs, but there is no treatment that will fix your symptoms at this time.  Please take Motrin (ibuprofen) as needed for your pain according to the instructions written on the box.  Alternatively, for the next five days you can take 600mg three times daily with meals (it may upset your stomach).  Take Valium as prescribed for severe pain. Do not drink alcohol, drive or participate in any other potentially dangerous activities while taking this medication as it may make you sleepy. Do not take this medication with any other sedating medications, either prescription or over-the-counter.     Please follow up with your doctor as soon as possible regarding today's ED visit and your back pain.  Return to the ED for worsening back pain, fever, weakness or numbness of either leg, or if you develop either (1) an inability to urinate or have bowel movements, or (2) loss of your ability to control your bathroom functions (if you start having "accidents"), or if you develop other new symptoms that concern you.  

## 2017-09-03 NOTE — ED Triage Notes (Signed)
Patient reports of working Wednesday and stepped in 12 foot hole with left leg. States right knee went to ground and left leg was dangling. Reports of increased pain since yesterday. Denies urinary or bowel issues.

## 2017-09-03 NOTE — ED Provider Notes (Signed)
Emergency Department Provider Note   I have reviewed the triage vital signs and the nursing notes.   HISTORY  Chief Complaint Back Pain   HPI Aaron Dixon is a 46 y.o. male with PMH of kidney stone and psoriasis presents emergency department for evaluation of lower back pain in the setting of fall 4 days ago. Patient was walking alongside a manhole that was uncovered when his left leg fell into it. He struck his right knee. He denies any pain, numbness, weakness in the legs. He notes severe pain in lower back that is worse with movement. At the time of injury he went to employee health and had an x-ray which showed no acute fracture. He did not want to take any medication that was prescription. He has been using Motrin which helped slightly but he continues to have significant pain. Denies any scrotal pain/bruising or pelvic discomfort. Denies any straddle injury.    Past Medical History:  Diagnosis Date  . Kidney stones   . Psoriatic arthritis [facility])     Patient Active Problem List   Diagnosis Date Noted  . Lower back injury 05/26/2015  . Right shoulder injury 01/21/2015  . Tobacco abuse 04/28/2013  . Psoriasis 04/28/2013    History reviewed. No pertinent surgical history.  Current Outpatient Rx  . Order #: [id number] Class: Historical Med  . Order #: [id number] Class: Normal  . Order #: [id number] Class: Print  . Order #: [id number] Class: Print  . Order #: [id number] Class: Print  . [START ON 09/04/2017] Order #: [id number] Class: Print    Allergies Patient has no known allergies.  No family history on file.  Social History Social History  Substance Use Topics  . Smoking status: Current Every Day Smoker    Packs/day: 1.00    Types: Cigarettes  . Smokeless tobacco: Never Used  . Alcohol use 0.0 oz/week     Comment: rarely    Review of Systems  Constitutional: No fever/chills Eyes: No visual changes. ENT: No sore throat. Cardiovascular: Denies chest  pain. Respiratory: Denies shortness of breath. Gastrointestinal: No abdominal pain.  No nausea, no vomiting.  No diarrhea.  No constipation. Genitourinary: Negative for dysuria. Musculoskeletal: Positive for back pain. Skin: Negative for rash. Neurological: Negative for headaches, focal weakness or numbness.  10-point ROS otherwise negative.  ____________________________________________   PHYSICAL EXAM:  VITAL SIGNS: ED Triage Vitals  Enc Vitals Group     BP 09/03/17 1050 139/70     Pulse Rate 09/03/17 1050 72     Resp 09/03/17 1050 18     Temp 09/03/17 1050 97.7 F (36.5 C)     Temp Source 09/03/17 1050 Oral     SpO2 09/03/17 1050 96 %     Weight 09/03/17 1051 200 lb (90.7 kg)     Height 09/03/17 1051  (1.778 m)     Pain Score 09/03/17 1050 8   Constitutional: Alert and oriented. Well appearing and in no acute distress. Eyes: Conjunctivae are normal. Head: Atraumatic. Nose: No congestion/rhinnorhea. Mouth/Throat: Mucous membranes are moist. Neck: No stridor.  Cardiovascular: Normal rate, regular rhythm. Good peripheral circulation. Grossly normal heart sounds.   Respiratory: Normal respiratory effort.  No retractions. Lungs CTAB. Gastrointestinal: Soft and nontender. No distention.  Musculoskeletal: No lower extremity tenderness nor edema. No gross deformities of extremities. Positive lumbar spine tenderness to palpation worse over the right paraspinal area. Positive straight-leg-rase test on the right.  Neurologic:  Normal speech and language. No gross focal neurologic deficits are appreciated.  Normal strength and sensation in the lower extremities. 2+ patellar reflexes.  Skin:  Skin is warm, dry and intact. No rash noted.  ____________________________________________  RADIOLOGY  Dg Lumbar Spine 2-3 Views  Result Date: 09/03/2017 CLINICAL DATA:  Acute low back pain for 4 days following injury. Initial encounter. EXAM: LUMBAR SPINE - 2-3 VIEW COMPARISON:   08/30/2017 and prior radiographs FINDINGS: No acute fracture or subluxation identified. Mild degenerative disc disease/spondylosis in the lower lumbar spine noted. No focal bony lesions noted. IMPRESSION: No acute abnormality. Electronically Signed   By: Harmon Pier M.D.   On: 09/03/2017 12:33    ____________________________________________   PROCEDURES  Procedure(s) performed:   Procedures  None ____________________________________________   INITIAL IMPRESSION / ASSESSMENT AND PLAN / ED COURSE  Pertinent labs & imaging results that were available during my care of the patient were reviewed by me and considered in my medical decision making (see chart for details).  Patient resents emergency department for evaluation of right lower back pain. There is tenderness to palpation over the lumbar spine immediately lateral to that. No fevers or chills. The patient has normal reflexes, sensation, strength. No red flag symptoms to suggest central cord impingement. Plan for pain medication, steroids, repeat x-ray to identify occult fracture, and reassess.   12:40 PM Patient is feeling much better. He is standing at bedside and ambulatory without significant difficulty. Discussed supportive care and PCP follow up.   At this time, I do not feel there is any life-threatening condition present. I have reviewed and discussed all results (EKG, imaging, lab, urine as appropriate), exam findings with patient. I have reviewed nursing notes and appropriate previous records.  I feel the patient is safe to be discharged home without further emergent workup. Discussed usual and customary return precautions. Patient and family (if present) verbalize understanding and are comfortable with this plan.  Patient will follow-up with their primary care provider. If they do not have a primary care provider, information for follow-up has been provided to them. All questions have been  answered.  ____________________________________________  FINAL CLINICAL IMPRESSION(S) / ED DIAGNOSES  Final diagnoses:  Acute right-sided low back pain without sciatica     MEDICATIONS GIVEN DURING THIS VISIT:  Medications  ketorolac (TORADOL) 30 MG/ML injection 30 mg (30 mg Intramuscular Given 09/03/17 1149)  diazepam (VALIUM) tablet 5 mg (5 mg Oral Given 09/03/17 1150)  predniSONE (DELTASONE) tablet 60 mg (60 mg Oral Given 09/03/17 1150)     NEW OUTPATIENT MEDICATIONS STARTED DURING THIS VISIT:  Discharge Medication List as of 09/03/2017 12:46 PM    START taking these medications   Details  diazepam (VALIUM) 5 MG tablet Take 1 tablet (5 mg total) by mouth every 8 (eight) hours as needed for muscle spasms., Starting Sun 09/03/2017, Print    ibuprofen (ADVIL,MOTRIN) 800 MG tablet Take 1 tablet (800 mg total) by mouth every 8 (eight) hours as needed., Starting Sun 09/03/2017, Print    predniSONE (DELTASONE) 20 MG tablet Take 1 tablet (20 mg total) by mouth daily., Starting Mon 09/04/2017, Until Fri 09/08/2017, Print        Note:  This document was prepared using Dragon voice recognition software and may include unintentional dictation errors.  Alona Bene, MD Emergency Medicine    Peder Allums, Arlyss Repress, MD 09/03/17 [phone]

## 2017-11-10 ENCOUNTER — Other Ambulatory Visit: Payer: Self-pay

## 2017-11-10 ENCOUNTER — Encounter (HOSPITAL_BASED_OUTPATIENT_CLINIC_OR_DEPARTMENT_OTHER): Payer: Self-pay | Admitting: Emergency Medicine

## 2017-11-10 ENCOUNTER — Emergency Department (HOSPITAL_BASED_OUTPATIENT_CLINIC_OR_DEPARTMENT_OTHER)
Admission: EM | Admit: 2017-11-10 | Discharge: 2017-11-10 | Disposition: A | Discharge status: Home / Self Care | Payer: BLUE CROSS/BLUE SHIELD | Attending: Physician Assistant | Admitting: Physician Assistant

## 2017-11-10 DIAGNOSIS — J329 Chronic sinusitis, unspecified: Secondary | ICD-10-CM

## 2017-11-10 DIAGNOSIS — Z79899 Other long term (current) drug therapy: Secondary | ICD-10-CM | POA: Diagnosis not present

## 2017-11-10 DIAGNOSIS — F17210 Nicotine dependence, cigarettes, uncomplicated: Secondary | ICD-10-CM | POA: Insufficient documentation

## 2017-11-10 DIAGNOSIS — R509 Fever, unspecified: Secondary | ICD-10-CM | POA: Diagnosis present

## 2017-11-10 MED ORDER — AMOXICILLIN-POT CLAVULANATE 875-125 MG PO TABS: 1.0000 | ORAL_TABLET | Freq: Two times a day (BID) | ORAL | 0 refills | Status: DC

## 2017-11-10 MED ORDER — SALINE SPRAY 0.65 % NA SOLN: 1.0000 | NASAL | 0 refills | Status: DC | PRN

## 2017-11-10 MED ORDER — FLUTICASONE PROPIONATE 50 MCG/ACT NA SUSP: 2.0000 | Freq: Every day | NASAL | 12 refills | Status: DC

## 2017-11-10 NOTE — ED Triage Notes (Signed)
Fever x 4 days with sinus pressure.

## 2017-11-10 NOTE — ED Notes (Signed)
ED Provider at bedside. 

## 2017-11-10 NOTE — Discharge Instructions (Signed)
This is likely a sinus infection.  Take antibiotic as prescribed.  Use Motrin and Tylenol for pain and fever.  Use the Flonase.  Use saline nasal rinses.  Have also given you a nasal spray to keep her nose moist.  Follow-up with ENT if symptoms not improving.  Avoid using any decongestant such as Sudafed.

## 2017-11-11 NOTE — ED Provider Notes (Signed)
[facility] [facility] EMERGENCY DEPARTMENT Provider Note   CSN: [id number] Arrival date & time: 11/10/17  1856     History   Chief Complaint Chief Complaint  Patient presents with  . Fever    HPI Aaron Dixon is a 46 y.o. male.  HPI 46 year old Caucasian male with no pertinent past medical history presents to the ED with complaints of sinus congestion and fever.  The patient states that he has developed sinus congestion for the past week.  Also reports low-grade fevers.  Reports sinus pressure.  Denies any rhinorrhea.  Reports postnasal drip.  Denies any associated ear pain, sore throat, sick contacts.    States he works in [employer] and his symptoms started after he was working last week and inhaling particles.  Patient denies any epistaxis.  Nothing makes his symptoms better or worse.  Patient has not tried any of her symptoms prior to arrival.  Patient denies any cocaine use.  Denies any history of same.  Patient feels like it may be his sinuses. Past Medical History:  Diagnosis Date  . Kidney stones   . Psoriatic arthritis [facility])     Patient Active Problem List   Diagnosis Date Noted  . Lower back injury 05/26/2015  . Right shoulder injury 01/21/2015  . Tobacco abuse 04/28/2013  . Psoriasis 04/28/2013    History reviewed. No pertinent surgical history.     Home Medications    Prior to Admission medications   Medication Sig Start Date End Date Taking? Authorizing Provider  buprenorphine-naloxone (SUBOXONE) 2-0.5 mg SUBL SL tablet Place 1 tablet under the tongue daily.   Yes [provider]  Adalimumab (HUMIRA ) Inject into the skin.    [provider]  amoxicillin-clavulanate (AUGMENTIN) 875-125 MG tablet Take 1 tablet by mouth every 12 (twelve) hours. 11/10/17   Rise MuLeaphart, Kenneth T, PA-C  cyclobenzaprine (FLEXERIL) 10 MG tablet Take 1 tablet (10 mg total) by mouth every 8 (eight) hours as needed. 06/08/15   Hudnall, Azucena FallenShane R, MD  diazepam  (VALIUM) 5 MG tablet Take 1 tablet (5 mg total) by mouth every 8 (eight) hours as needed for muscle spasms. 09/03/17   Long, Arlyss RepressJoshua G, MD  fluticasone (FLONASE) 50 MCG/ACT nasal spray Place 2 sprays into both nostrils daily. 11/10/17   Rise MuLeaphart, Kenneth T, PA-C  HYDROcodone-acetaminophen (NORCO) 7.5-325 MG per tablet Take 1 tablet by mouth every 6 (six) hours as needed for moderate pain. 06/08/15   Hudnall, Azucena FallenShane R, MD  ibuprofen (ADVIL,MOTRIN) 800 MG tablet Take 1 tablet (800 mg total) by mouth every 8 (eight) hours as needed. 09/03/17   Long, Arlyss RepressJoshua G, MD  sodium chloride (OCEAN) 0.65 % SOLN nasal spray Place 1 spray into both nostrils as needed for congestion. 11/10/17   Rise MuLeaphart, Kenneth T, PA-C    Family History No family history on file.  Social History Social History   Tobacco Use  . Smoking status: Current Every Day Smoker    Packs/day: 1.00    Types: Cigarettes  . Smokeless tobacco: Never Used  Substance Use Topics  . Alcohol use: Yes    Alcohol/week: 0.0 oz    Comment: rarely  . Drug use: No     Allergies   Patient has no known allergies.   Review of Systems Review of Systems  Constitutional: Positive for fever. Negative for activity change, appetite change and chills.  HENT: Positive for congestion, postnasal drip, sinus pressure and sinus pain. Negative for ear pain, rhinorrhea and sore throat.  Gastrointestinal: Negative for vomiting.  Musculoskeletal: Negative for myalgias.  Skin: Positive for rash.     Physical Exam Updated Vital Signs BP (!) 146/84 (BP Location: Left Arm)   Pulse 82   Temp 98.2 F (36.8 C) (Oral)   Resp 18   Ht 5\' 10"  (1.778 m)   Wt 95.3 kg (210 lb)   SpO2 99%   BMI 30.13 kg/m   Physical Exam  Constitutional: He appears well-developed and well-nourished. No distress.  HENT:  Head: Normocephalic and atraumatic.  Right Ear: Tympanic membrane, external ear and ear canal normal.  Left Ear: Tympanic membrane, external ear and ear  canal normal.  Nose: Mucosal edema and rhinorrhea present. Right sinus exhibits maxillary sinus tenderness. Right sinus exhibits no frontal sinus tenderness. Left sinus exhibits maxillary sinus tenderness. Left sinus exhibits no frontal sinus tenderness.  Eyes: Right eye exhibits no discharge. Left eye exhibits no discharge. No scleral icterus.  Neck: Normal range of motion. Neck supple.  Pulmonary/Chest: Effort normal and breath sounds normal. No stridor. No respiratory distress. He has no wheezes. He has no rales. He exhibits no tenderness.  Musculoskeletal: Normal range of motion.  Lymphadenopathy:    He has no cervical adenopathy.  Neurological: He is alert.  Skin: No pallor.  Psychiatric: His behavior is normal. Judgment and thought content normal.  Nursing note and vitals reviewed.    ED Treatments / Results  Labs (all labs ordered are listed, but only abnormal results are displayed) Labs Reviewed - No data to display  EKG  EKG Interpretation None       Radiology No results found.  Procedures Procedures (including critical care time)  Medications Ordered in ED Medications - No data to display   Initial Impression / Assessment and Plan / ED Course  I have reviewed the triage vital signs and the nursing notes.  Pertinent labs & imaging results that were available during my care of the patient were reviewed by me and considered in my medical decision making (see chart for details).     Patient complaining of symptoms of sinusitis.  Given purulent drainage and low-grade fevers will treat antibiotics.  Concern for acute bacterial rhinosinusitis.  Patient discharged with Augmentin.  Instructions given for warm saline nasal wash and recommendations for follow-up with primary care physician.    Pt is hemodynamically stable, in NAD, & able to ambulate in the ED. Evaluation does not show pathology that would require ongoing emergent intervention or inpatient treatment. I  explained the diagnosis to the patient. Pain has been managed & has no complaints prior to dc. Pt is comfortable with above plan and is stable for discharge at this time. All questions were answered prior to disposition. Strict return precautions for f/u to the ED were discussed. Encouraged follow up with PCP.       Final Clinical Impressions(s) / ED Diagnoses   Final diagnoses:  Sinusitis, unspecified chronicity, unspecified location    ED Discharge Orders        Ordered    amoxicillin-clavulanate (AUGMENTIN) 875-125 MG tablet  Every 12 hours     11/10/17 2027    fluticasone (FLONASE) 50 MCG/ACT nasal spray  Daily     11/10/17 2027    sodium chloride (OCEAN) 0.65 % SOLN nasal spray  As needed     11/10/17 2027       Rise MuLeaphart, Kenneth T, PA-C 11/11/17 0049    Abelino DerrickMackuen, Courteney Lyn, MD 11/14/17 [phone]

## 2018-02-06 ENCOUNTER — Encounter (HOSPITAL_BASED_OUTPATIENT_CLINIC_OR_DEPARTMENT_OTHER): Payer: Self-pay | Admitting: *Deleted

## 2018-02-06 ENCOUNTER — Emergency Department (HOSPITAL_BASED_OUTPATIENT_CLINIC_OR_DEPARTMENT_OTHER)
Admission: EM | Admit: 2018-02-06 | Discharge: 2018-02-06 | Disposition: A | Discharge status: Home / Self Care | Payer: BLUE CROSS/BLUE SHIELD | Attending: Emergency Medicine | Admitting: Emergency Medicine

## 2018-02-06 ENCOUNTER — Other Ambulatory Visit: Payer: Self-pay

## 2018-02-06 DIAGNOSIS — M25511 Pain in right shoulder: Secondary | ICD-10-CM | POA: Insufficient documentation

## 2018-02-06 DIAGNOSIS — Z79899 Other long term (current) drug therapy: Secondary | ICD-10-CM | POA: Diagnosis not present

## 2018-02-06 DIAGNOSIS — F17210 Nicotine dependence, cigarettes, uncomplicated: Secondary | ICD-10-CM | POA: Insufficient documentation

## 2018-02-06 HISTORY — DX: Other chronic pain: G89.29

## 2018-02-06 HISTORY — DX: Opioid dependence, uncomplicated: F11.20

## 2018-02-06 MED ORDER — MELOXICAM 7.5 MG PO TABS: 7.5000 mg | ORAL_TABLET | Freq: Every day | ORAL | 0 refills | Status: DC

## 2018-02-06 NOTE — Discharge Instructions (Signed)
Please take Tylenol (acetaminophen) to relieve your pain.  You may take tylenol, up to 1,000 mg (two extra strength pills).  Do not take more than 3,000 mg tylenol in a 24 hour period.  Please check all medication labels as many medications such as pain and cold medications may contain tylenol. Please do not drink alcohol while taking this medication.   I have given you a prescription for Mobic (meloxicam) today.  Mobic is a NSAID medication and you should not take it with other NSAIDs.  Examples of other NSAIDS include motrin, ibuprofen, aleve, naproxen, and Voltaren.  Please monitor your bowel movements for dark, tarry, sticky stools. If you have any bowel movements like this you need to stop taking mobic and call your doctor as this may represent a stomach ulcer from taking NSAIDS.    Please make sure you are doing gentle stretching and moving your shoulder around.  If you do not move your shoulder your arm will get stiff and may require physical therapy  or surgery to fix.  If you develop fevers or have any concerns please seek additional medical care.

## 2018-02-06 NOTE — ED Provider Notes (Signed)
[facility] EMERGENCY DEPARTMENT Provider Note   CSN: [id number] Arrival date & time: 02/06/18  1617     History   Chief Complaint Chief Complaint  Patient presents with  . Shoulder Pain    HPI Aaron Dixon is a 47 y.o. male with a history of psoriatic arthritis, opioid addiction taking suboxone, who presents today for evaluation of right shoulder/arm pain.  He reports that his pain is been present for approximately 2 days and he woke up with his pain.  He denies any recent injuries or new activities.  No recent fevers or chills.  He does have a history of psoriatic arthritis, however reports that this does not feel like that.  He reports that [facility] previously, that healed without complications.  No numbness or tingling, denies chest pain or shortness of breath.  There is no localized swelling, he has not gotten any injections in that arm recently.  HPI  Past Medical History:  Diagnosis Date  . Chronic pain   . Kidney stones   . Opiate addiction (HCC)   . Psoriatic arthritis [facility](HCC)     Patient Active Problem List   Diagnosis Date Noted  . Lower back injury 05/26/2015  . Right shoulder injury 01/21/2015  . Tobacco abuse 04/28/2013  . Psoriasis 04/28/2013    History reviewed. No pertinent surgical history.     Home Medications    Prior to Admission medications   Medication Sig Start Date End Date Taking? Authorizing Provider  Adalimumab (HUMIRA [place]) Inject into the skin.    [provider]  amoxicillin-clavulanate (AUGMENTIN) 875-125 MG tablet Take 1 tablet by mouth every 12 (twelve) hours. 11/10/17   Rise MuLeaphart, Kenneth T, PA-C  buprenorphine-naloxone (SUBOXONE) 2-0.5 mg SUBL SL tablet Place 1 tablet under the tongue daily.    [provider]  cyclobenzaprine (FLEXERIL) 10 MG tablet Take 1 tablet (10 mg total) by mouth every 8 (eight) hours as needed. 06/08/15   Hudnall, Azucena FallenShane R, MD  diazepam (VALIUM) 5 MG tablet Take 1 tablet  (5 mg total) by mouth every 8 (eight) hours as needed for muscle spasms. 09/03/17   Long, Arlyss RepressJoshua G, MD  fluticasone (FLONASE) 50 MCG/ACT nasal spray Place 2 sprays into both nostrils daily. 11/10/17   Rise MuLeaphart, Kenneth T, PA-C  HYDROcodone-acetaminophen (NORCO) 7.5-325 MG per tablet Take 1 tablet by mouth every 6 (six) hours as needed for moderate pain. 06/08/15   Hudnall, Azucena FallenShane R, MD  ibuprofen (ADVIL,MOTRIN) 800 MG tablet Take 1 tablet (800 mg total) by mouth every 8 (eight) hours as needed. 09/03/17   Long, Arlyss RepressJoshua G, MD  meloxicam (MOBIC) 7.5 MG tablet Take 1 tablet (7.5 mg total) by mouth daily. 02/06/18   Cristina GongHammond, Jaelyne Deeg W, PA-C  sodium chloride (OCEAN) 0.65 % SOLN nasal spray Place 1 spray into both nostrils as needed for congestion. 11/10/17   Rise MuLeaphart, Kenneth T, PA-C    Family History History reviewed. No pertinent family history.  Social History Social History   Tobacco Use  . Smoking status: Current Every Day Smoker    Packs/day: 1.00    Types: Cigarettes  . Smokeless tobacco: Never Used  Substance Use Topics  . Alcohol use: Yes    Alcohol/week: 0.0 oz    Comment: rarely  . Drug use: No     Allergies   Patient has no known allergies.   Review of Systems Review of Systems  Constitutional: Negative for chills and fever.  Respiratory: Negative for chest  tightness and shortness of breath.   Cardiovascular: Negative for chest pain.  Musculoskeletal: Negative for arthralgias, joint swelling and neck pain.       Right shoulder/upper arm pain     Physical Exam Updated Vital Signs BP (!) 145/89 (BP Location: Left Arm)   Pulse 74   Temp 97.8 F (36.6 C) (Oral)   Resp 18   Ht 5\' 9"  (1.753 m)   Wt 96.2 kg (212 lb)   SpO2 98%   BMI 31.31 kg/m   Physical Exam  Constitutional: He appears well-developed and well-nourished.  HENT:  Head: Normocephalic and atraumatic.  Eyes: Conjunctivae are normal.  Neck: Normal range of motion. Neck supple.  No midline tenderness  to palpation.  Cardiovascular: Intact distal pulses.  2+ radial pulses bilaterally  Musculoskeletal:  R arm: abduction past 90 limited secondary to pain.  5/5 strength in wrist, fingers, and elbow extension, 4/5 strength with elbow flexion secondary to pain.  Unable to perform  impingement testing secondary to pain.  There is no obvious swelling, crepitus, or ecchymosis.  Neurological:  Sensation intact to right arm  Skin: Skin is warm and dry. He is not diaphoretic.  Nursing note and vitals reviewed.    ED Treatments / Results  Labs (all labs ordered are listed, but only abnormal results are displayed) Labs Reviewed - No data to display  EKG  EKG Interpretation None       Radiology No results found.  Procedures Procedures (including critical care time)  Medications Ordered in ED Medications - No data to display   Initial Impression / Assessment and Plan / ED Course  I have reviewed the triage vital signs and the nursing notes.  Pertinent labs & imaging results that were available during my care of the patient were reviewed by me and considered in my medical decision making (see chart for details).  Clinical Course as of Feb 07 1820  Tue Feb 06, 2018  1735 Attempted to see patient, he was taking a phone call, will return after phone call completed and finish seeing another patient.   [EH]    Clinical Course User Index [EH] Cristina Gong, PA-C   Patient presents today for evaluation of 2 days of right upper arm/shoulder pain, no specific injury recalled.  He has a history of psoriasis and has had psoriatic arthritis in the past, however reports that this feels different.  He was offered x-rays, however declined stating that he needs to get home.  No obvious crepitus or deformities palpated.  As ibuprofen and Tylenol have not been helping we will give him prescription for Mobic.  He was advised not to take other NSAID medications while taking this.  PCP follow-up in  1 week, he already has appointment scheduled.  Return precautions discussed and he stated his understanding.  Discharged home.  Final Clinical Impressions(s) / ED Diagnoses   Final diagnoses:  Acute pain of right shoulder    ED Discharge Orders        Ordered    meloxicam (MOBIC) 7.5 MG tablet  Daily     02/06/18 1814       Cristina Gong, Cordelia Poche 02/06/18 Merri Brunette, MD 02/07/18 [phone]

## 2018-02-06 NOTE — ED Triage Notes (Addendum)
Pt c/o right shoulder pain with movt x 2 days , denies injury HX chronic pain

## 2018-02-28 ENCOUNTER — Emergency Department (HOSPITAL_BASED_OUTPATIENT_CLINIC_OR_DEPARTMENT_OTHER): Payer: BLUE CROSS/BLUE SHIELD

## 2018-02-28 ENCOUNTER — Other Ambulatory Visit: Payer: Self-pay

## 2018-02-28 ENCOUNTER — Encounter (HOSPITAL_BASED_OUTPATIENT_CLINIC_OR_DEPARTMENT_OTHER): Payer: Self-pay

## 2018-02-28 ENCOUNTER — Emergency Department (HOSPITAL_BASED_OUTPATIENT_CLINIC_OR_DEPARTMENT_OTHER)
Admission: EM | Admit: 2018-02-28 | Discharge: 2018-02-28 | Disposition: A | Discharge status: Home / Self Care | Payer: BLUE CROSS/BLUE SHIELD | Attending: Emergency Medicine | Admitting: Emergency Medicine

## 2018-02-28 DIAGNOSIS — Z79899 Other long term (current) drug therapy: Secondary | ICD-10-CM | POA: Diagnosis not present

## 2018-02-28 DIAGNOSIS — M25511 Pain in right shoulder: Secondary | ICD-10-CM

## 2018-02-28 DIAGNOSIS — F17210 Nicotine dependence, cigarettes, uncomplicated: Secondary | ICD-10-CM | POA: Diagnosis not present

## 2018-02-28 MED ORDER — NAPROXEN 250 MG PO TABS
500.0000 mg | ORAL_TABLET | Freq: Once | ORAL | Status: DC
  Filled 2018-02-28: qty 2

## 2018-02-28 MED ORDER — NAPROXEN 500 MG PO TABS: 500.0000 mg | ORAL_TABLET | Freq: Two times a day (BID) | ORAL | 0 refills | Status: AC

## 2018-02-28 NOTE — ED Notes (Signed)
Pt left before receiving discharge information.

## 2018-02-28 NOTE — ED Triage Notes (Signed)
C/o right shoulder pain x 1 month-denies injury prior to pain start-NAD-steady gait

## 2018-02-28 NOTE — Discharge Instructions (Signed)
Please read and follow all provided instructions.  You have been seen today for pain in your right shoulder.   Tests performed today include: An x-ray of the affected area - does NOT show any broken bones or dislocations.  Vital signs. See below for your results today.   Medications:  Naproxen is a nonsteroidal anti-inflammatory medication that will help with pain and swelling. Be sure to take this medication as prescribed with food, 1 pill every 12 hours,  It should be taken with food, as it can cause stomach upset, and more seriously, stomach bleeding. Do not take other nonsteroidal anti-inflammatory medications with this such as Advil, Motrin, or Aleve. You may supplement with Tylenol per over the counter dosing instructions.    Follow-up instructions: Please follow-up with your primary care provider or the provided orthopedic physician (bone specialist) if you continue to have significant pain in 1 week. In this case you may have a more severe injury that requires further care.   Return instructions:  Please return if your hands or fingers are numb or tingling, appear gray or blue, or you have severe pain  Please return to the Emergency Department if you experience worsening symptoms.  Please return if you have any other emergent concerns. Additional Information:  Your vital signs today were: BP 130/89 (BP Location: Left Arm)    Pulse 75    Temp 98.9 F (37.2 C) (Oral)    Resp 18    Ht 5\' 9"  (1.753 m)    Wt 100.1 kg (220 lb 10.9 oz)    SpO2 96%    BMI 32.59 kg/m  If your blood pressure (BP) was elevated above 135/85 this visit, please have this repeated by your doctor within one month. ---------------

## 2018-02-28 NOTE — ED Provider Notes (Signed)
[facility] EMERGENCY DEPARTMENT Provider Note   CSN: [id number] Arrival date & time: 02/28/18  1204     History   Chief Complaint Chief Complaint  Patient presents with  . Shoulder Pain    HPI Aaron Dixon is a 47 y.o. male with a history of psoriatic arthritis, opioidaddiction taking suboxone, and prior R shoulder AC injury in 2016 which healed without complications who presents the emergency department complaining of right shoulder pain for the past 1 month.  Reports that the pain is fairly persistent, present it is a 4 out of 10 in severity, worse with movement of the shoulder and when trying to sleep on it at night, no alleviating factors.  States he has tried Tylenol and Motrin at home without significant improvement.  He does not recall any specific injury or change in activity.  Denies numbness, weakness, tingling, chest pain, dyspnea, fever, chills or chills.  States he has a history of psoriatic arthritis however this does not feel similar.   HPI  Past Medical History:  Diagnosis Date  . Chronic pain   . Kidney stones   . Opiate addiction (HCC)   . Psoriatic arthritis [facility](HCC)     Patient Active Problem List   Diagnosis Date Noted  . Lower back injury 05/26/2015  . Right shoulder injury 01/21/2015  . Tobacco abuse 04/28/2013  . Psoriasis 04/28/2013    History reviewed. No pertinent surgical history.     Home Medications    Prior to Admission medications   Medication Sig Start Date End Date Taking? Authorizing Provider  Adalimumab (HUMIRA [place]) Inject into the skin.    [provider]  buprenorphine-naloxone (SUBOXONE) 2-0.5 mg SUBL SL tablet Place 1 tablet under the tongue daily.    [provider]  naproxen (NAPROSYN) 500 MG tablet Take 1 tablet (500 mg total) by mouth 2 (two) times daily. 02/28/18   Lavette Yankovich, Pleas KochSamantha R, PA-C    Family History History reviewed. No pertinent family history.  Social History Social History     Tobacco Use  . Smoking status: Current Every Day Smoker    Packs/day: 1.00    Types: Cigarettes  . Smokeless tobacco: Never Used  Substance Use Topics  . Alcohol use: No    Alcohol/week: 0.0 oz    Frequency: Never  . Drug use: No     Allergies   Patient has no known allergies.   Review of Systems Review of Systems Constitutional: Negative for chills and fever.  Respiratory: Negative for shortness of breath.   Cardiovascular: Negative for chest pain.  Musculoskeletal: Positive for arthralgias (Right shoulder). Negative for joint swelling.  Neurological: Negative for weakness and numbness.  Physical Exam Updated Vital Signs BP 130/89 (BP Location: Left Arm)   Pulse 75   Temp 98.9 F (37.2 C) (Oral)   Resp 18   Ht 5\' 9"  (1.753 m)   Wt 100.1 kg (220 lb 10.9 oz)   SpO2 96%   BMI 32.59 kg/m   Physical Exam Physical Exam  Constitutional: He appears well-developed and well-nourished. No distress.  HENT:  Head: Normocephalic and atraumatic.  Eyes: Conjunctivae are normal. Right eye exhibits no discharge. Left eye exhibits no discharge.  Neck: Normal range of motion. Neck supple.  No midline tenderness.  Cardiovascular: Normal rate and regular rhythm.  2+ symmetric radial pulses.  Pulmonary/Chest: Effort normal and breath sounds normal.  Musculoskeletal:  Upper extremities: No obvious deformity, appreciable swelling, erythema, ecchymosis, or overlying warmth.  Patient has  full range of motion to all joints with the exception of right shoulder flexion and abduction which are limited secondary to pain.  Patient is able to flex to 90 degrees.  He is diffusely tender to the right shoulder, no focal/point tenderness to palpation.  Otherwise nontender.  Patient is uncomfortable with right upper extremity empty can test, however he attained strength with this.   Neurological: He is alert.  Clear speech.  5 out of 5 grip strength bilaterally.  5 out of 5 strength with elbow  flexion and extension bilaterally.  Sensation grossly intact bilateral upper extremities.  Psychiatric: He has a normal mood and affect. His behavior is normal. Thought content normal.  Nursing note and vitals reviewed.  ED Treatments / Results  Labs (all labs ordered are listed, but only abnormal results are displayed) Labs Reviewed - No data to display  EKG  EKG Interpretation None       Radiology Dg Shoulder Right  Result Date: 02/28/2018 CLINICAL DATA:  Right shoulder pain for 1 month without recent injury. EXAM: RIGHT SHOULDER - 2+ VIEW COMPARISON:  None. FINDINGS: There is no evidence of fracture or dislocation. There is no evidence of arthropathy or other focal bone abnormality. Soft tissues are unremarkable. IMPRESSION: Normal right shoulder. Electronically Signed   By: Lupita Raider, M.D.   On: 02/28/2018 15:35    Procedures Procedures (including critical care time)  Medications Ordered in ED Medications  naproxen (NAPROSYN) tablet 500 mg (not administered)     Initial Impression / Assessment and Plan / ED Course  I have reviewed the triage vital signs and the nursing notes.  Pertinent labs & imaging results that were available during my care of the patient were reviewed by me and considered in my medical decision making (see chart for details).    Patient presents with atraumatic right shoulder pain.  Right shoulder is diffusely tender to palpation, there is some limitation in shoulder flexion and abduction secondary to pain, pain with right shoulder empty can test.  Patient is neurovascularly intact distally.  X-ray ordered and negative for acute abnormality. No erythema, warmth, or fever- no indication of septic joint. Patient presentation somewhat suspicious for rotator cuff etiology. Will provide prescription for Naproxen with sports medicine follow up- patient has seen Dr. Pearletha Forge in the past and is agreeable to this. I discussed results, treatment plan, need for  sports medicine follow-up, and return precautions with the patient. Provided opportunity for questions, patient confirmed understanding and is in agreement with plan.   Final Clinical Impressions(s) / ED Diagnoses   Final diagnoses:  Right shoulder pain, unspecified chronicity    ED Discharge Orders        Ordered    naproxen (NAPROSYN) 500 MG tablet  2 times daily     02/28/18 [address], [place], PA-C 02/28/18 1700    Benjiman Core, MD 03/01/18 1636

## 2019-01-07 ENCOUNTER — Emergency Department (HOSPITAL_COMMUNITY): Payer: BLUE CROSS/BLUE SHIELD

## 2019-01-07 ENCOUNTER — Emergency Department (HOSPITAL_COMMUNITY)
Admission: EM | Admit: 2019-01-07 | Discharge: 2019-01-08 | Disposition: A | Discharge status: Home / Self Care | Payer: BLUE CROSS/BLUE SHIELD | Attending: Emergency Medicine | Admitting: Emergency Medicine

## 2019-01-07 ENCOUNTER — Other Ambulatory Visit: Payer: Self-pay

## 2019-01-07 ENCOUNTER — Encounter (HOSPITAL_COMMUNITY): Payer: Self-pay

## 2019-01-07 DIAGNOSIS — F17210 Nicotine dependence, cigarettes, uncomplicated: Secondary | ICD-10-CM | POA: Insufficient documentation

## 2019-01-07 DIAGNOSIS — R079 Chest pain, unspecified: Secondary | ICD-10-CM

## 2019-01-07 LAB — CBC
HEMATOCRIT: 49.6 % (ref 39.0–52.0)
Hemoglobin: 15.8 g/dL (ref 13.0–17.0)
MCH: 28.8 pg (ref 26.0–34.0)
MCHC: 31.9 g/dL (ref 30.0–36.0)
MCV: 90.5 fL (ref 80.0–100.0)
Platelets: 253 10*3/uL (ref 150–400)
RBC: 5.48 MIL/uL (ref 4.22–5.81)
RDW: 12.3 % (ref 11.5–15.5)
WBC: 12.9 10*3/uL — AB (ref 4.0–10.5)
nRBC: 0 % (ref 0.0–0.2)

## 2019-01-07 LAB — I-STAT TROPONIN, ED: Troponin i, poc: 0 ng/mL (ref 0.00–0.08)

## 2019-01-07 LAB — BASIC METABOLIC PANEL
Anion gap: 11 (ref 5–15)
BUN: 8 mg/dL (ref 6–20)
CHLORIDE: 100 mmol/L (ref 98–111)
CO2: 25 mmol/L (ref 22–32)
CREATININE: 1.06 mg/dL (ref 0.61–1.24)
Calcium: 9.4 mg/dL (ref 8.9–10.3)
GFR calc Af Amer: >60 mL/min (ref >60)
GFR calc non Af Amer: >60 mL/min (ref >60)
GLUCOSE: 140 mg/dL — AB (ref 70–99)
Potassium: 3.9 mmol/L (ref 3.5–5.1)
SODIUM: 136 mmol/L (ref 135–145)

## 2019-01-07 MED ORDER — SODIUM CHLORIDE 0.9% FLUSH
3.0000 mL | Freq: Once | INTRAVENOUS | Status: AC
  Administered 2019-01-07: 3 mL via INTRAVENOUS

## 2019-01-07 NOTE — ED Triage Notes (Signed)
Pt to ER tonight for chest discomfort, no N&V pt states when started, he was sweating a bit.  Pt also c/o light headedness starting a little afterward.  Pt states he feels tired now.

## 2019-01-08 MED ORDER — IBUPROFEN 600 MG PO TABS: 600.0000 mg | ORAL_TABLET | Freq: Four times a day (QID) | ORAL | 0 refills | Status: DC | PRN

## 2019-01-08 NOTE — ED Provider Notes (Signed)
[facility] EMERGENCY [facility]PART[facility] Provider Note   CSN: [id number] Arrival date & time: 01/07/19  2253     History   Chief Complaint Chief Complaint  Patient presents with  . Chest Pain    HPI Cory Munchimothy D Winkleman is a 48 y.o. male.  Patient to ED for evaluation of chest pain that started while at rest this evening around 9:00 pm. He describes sharp chest pain at left sternal border that lasted 10 minutes. It did not affect breathing, and there was no nausea. He reports a brief second episode of pain and now as a steady discomfort that has no modifying factors. He states that he did some shoveling earlier today and is not typically that physically active. No significant medical history. Patient is a smoker.   The history is provided by the patient. No language interpreter was used.  Chest Pain  Associated symptoms: no abdominal pain, no cough, no fever, no nausea and no shortness of breath     Past Medical History:  Diagnosis Date  . Chronic pain   . Kidney stones   . Opiate addiction (HCC)   . Psoriatic arthritis [facility](HCC)     Patient Active Problem List   Diagnosis Date Noted  . Lower back injury 05/26/2015  . Right shoulder injury 01/21/2015  . Tobacco abuse 04/28/2013  . Psoriasis 04/28/2013    History reviewed. No pertinent surgical history.      Home Medications    Prior to Admission medications   Medication Sig Start Date End Date Taking? Authorizing Provider  buprenorphine-naloxone (SUBOXONE) 2-0.5 mg SUBL SL tablet Place 1 tablet under the tongue daily as needed (opioid dependence).    Yes [provider]  naproxen (NAPROSYN) 500 MG tablet Take 1 tablet (500 mg total) by mouth 2 (two) times daily. Patient not taking: Reported on 01/08/2019 02/28/18   Petrucelli, Pleas KochSamantha R, PA-C    Family History History reviewed. No pertinent family history.  Social History Social History   Tobacco Use  . Smoking status: Current Every Day Smoker      Packs/day: 1.00    Types: Cigarettes  . Smokeless tobacco: Never Used  Substance Use Topics  . Alcohol use: No    Alcohol/week: 0.0 standard drinks    Frequency: Never  . Drug use: No     Allergies   Patient has no known allergies.   Review of Systems Review of Systems  Constitutional: Negative for chills and fever.  Respiratory: Negative.  Negative for cough and shortness of breath.   Cardiovascular: Positive for chest pain.  Gastrointestinal: Negative.  Negative for abdominal pain and nausea.  Musculoskeletal: Negative.   Skin: Negative.   Neurological: Negative.      Physical Exam Updated Vital Signs BP 114/70   Pulse 66   Temp 97.8 F (36.6 C) (Oral)   Resp 11   Ht 5\' 10"  (1.778 m)   Wt 96.2 kg   SpO2 97%   BMI 30.42 kg/m   Physical Exam Vitals signs and nursing note reviewed.  Constitutional:      Appearance: He is well-developed.  HENT:     Head: Normocephalic.  Neck:     Musculoskeletal: Normal range of motion and neck supple.  Cardiovascular:     Rate and Rhythm: Normal rate and regular rhythm.  Pulmonary:     Effort: Pulmonary effort is normal.     Breath sounds: Normal breath sounds. No wheezing, rhonchi or rales.  Chest:  Chest wall: No tenderness.  Abdominal:     General: Bowel sounds are normal.     Palpations: Abdomen is soft.     Tenderness: There is no abdominal tenderness. There is no guarding or rebound.  Musculoskeletal: Normal range of motion.  Skin:    General: Skin is warm and dry.     Findings: No rash.  Neurological:     General: No focal deficit present.     Mental Status: He is alert and oriented to person, place, and time.      ED Treatments / Results  Labs (all labs ordered are listed, but only abnormal results are displayed) Labs Reviewed  BASIC METABOLIC PANEL - Abnormal; Notable for the following components:      Result Value   Glucose, Bld 140 (*)    All other components within normal limits  CBC -  Abnormal; Notable for the following components:   WBC 12.9 (*)    All other components within normal limits  I-STAT TROPONIN, ED   Results for orders placed or performed during the hospital encounter of 01/07/19  Basic metabolic panel  Result Value Ref Range   Sodium 136 135 - 145 mmol/L   Potassium 3.9 3.5 - 5.1 mmol/L   Chloride 100 98 - 111 mmol/L   CO2 25 22 - 32 mmol/L   Glucose, Bld 140 (H) 70 - 99 mg/dL   BUN 8 6 - 20 mg/dL   Creatinine, Ser 2.94 0.61 - 1.24 mg/dL   Calcium 9.4 8.9 - 76.5 mg/dL   GFR calc non Af Amer >60 >60 mL/min   GFR calc Af Amer >60 >60 mL/min   Anion gap 11 5 - 15  CBC  Result Value Ref Range   WBC 12.9 (H) 4.0 - 10.5 K/uL   RBC 5.48 4.22 - 5.81 MIL/uL   Hemoglobin 15.8 13.0 - 17.0 g/dL   HCT 46.5 03.5 - 46.5 %   MCV 90.5 80.0 - 100.0 fL   MCH 28.8 26.0 - 34.0 pg   MCHC 31.9 30.0 - 36.0 g/dL   RDW 68.1 27.5 - 17.0 %   Platelets 253 150 - 400 K/uL   nRBC 0.0 0.0 - 0.2 %  I-stat troponin, ED  Result Value Ref Range   Troponin i, poc 0.00 0.00 - 0.08 ng/mL   Comment 3             EKG EKG Interpretation  Date/Time:  Monday January 07 2019 22:59:19 EST Ventricular Rate:  72 PR Interval:    QRS Duration: 94 QT Interval:  415 QTC Calculation: 455 R Axis:   78 Text Interpretation:  Sinus rhythm Baseline wander in lead(s) V3 Interpretation limited secondary to artifact No previous ECGs available Confirmed by Zadie Rhine ([id number]) on 01/07/2019 11:05:16 PM   Radiology Dg Chest 2 View  Result Date: 01/07/2019 CLINICAL DATA:  Left-sided chest pain beginning today with dyspnea EXAM: CHEST - 2 VIEW COMPARISON:  03/13/2012 and 03/14/2017 FINDINGS: The heart size and mediastinal contours are within normal limits. Both lungs are clear. Stable slight height loss of the T8 vertebral body. No acute osseous abnormality. IMPRESSION: No active cardiopulmonary disease. Electronically Signed   By: Tollie Eth M.D.   On: 01/07/2019 23:33     Procedures Procedures (including critical care time)  Medications Ordered in ED Medications  sodium chloride flush (NS) 0.9 % injection 3 mL (3 mLs Intravenous Given 01/07/19 2311)     Initial Impression / Assessment and Plan /  ED Course  I have reviewed the triage vital signs and the nursing notes.  Pertinent labs & imaging results that were available during my care of the patient were reviewed by me and considered in my medical decision making (see chart for details).     Patient to ED with complaint of chest pain - sharp, brief, no SOB or nausea.   Patient has a heart score of 1. Symptoms are atypical for ACS and he has a negative EKG for ischemic change, negative troponin, normal CXR. Do not feel symptoms represent heart ischemia.   He had been physically exerted earlier in the day. Most likely the source of discomfort. Patient care discussed with Dr. Preston FleetingGlick. He is felt appropriate for discharge home. Will Rx ibuprofen and recommend PCP follow up prn.   Final Clinical Impressions(s) / ED Diagnoses   Final diagnoses:  None   1. Nonspecific chest pain.   ED Discharge Orders    None       Danne HarborUpstill, Timoty Bourke, PA-C 01/08/19 0021    Dione BoozeGlick, David, MD 01/08/19 [phone]

## 2019-01-08 NOTE — Discharge Instructions (Addendum)
Take ibuprofen as directed for pain. Recommend warm compresses. Follow up with your doctor if symptoms persist. Return here with any new or concerning symptoms.

## 2020-03-29 ENCOUNTER — Other Ambulatory Visit: Payer: Self-pay

## 2020-03-29 ENCOUNTER — Emergency Department (HOSPITAL_COMMUNITY)
Admission: EM | Admit: 2020-03-29 | Discharge: 2020-03-29 | Disposition: A | Discharge status: Home / Self Care | Payer: Managed Care, Other (non HMO) | Attending: Emergency Medicine | Admitting: Emergency Medicine

## 2020-03-29 ENCOUNTER — Encounter (HOSPITAL_COMMUNITY): Payer: Self-pay | Admitting: Emergency Medicine

## 2020-03-29 DIAGNOSIS — H9201 Otalgia, right ear: Secondary | ICD-10-CM | POA: Diagnosis present

## 2020-03-29 DIAGNOSIS — H6121 Impacted cerumen, right ear: Secondary | ICD-10-CM | POA: Diagnosis not present

## 2020-03-29 DIAGNOSIS — R0981 Nasal congestion: Secondary | ICD-10-CM | POA: Diagnosis not present

## 2020-03-29 DIAGNOSIS — F17210 Nicotine dependence, cigarettes, uncomplicated: Secondary | ICD-10-CM | POA: Insufficient documentation

## 2020-03-29 DIAGNOSIS — R030 Elevated blood-pressure reading, without diagnosis of hypertension: Secondary | ICD-10-CM | POA: Diagnosis not present

## 2020-03-29 MED ORDER — FLUTICASONE PROPIONATE 50 MCG/ACT NA SUSP: 1.0000 | Freq: Every day | NASAL | 0 refills | Status: AC | PRN

## 2020-03-29 MED ORDER — CIPROFLOXACIN-DEXAMETHASONE 0.3-0.1 % OT SUSP
4.0000 [drp] | Freq: Once | OTIC | Status: AC
  Administered 2020-03-29: 20:00:00 4 [drp] via OTIC
  Filled 2020-03-29: qty 7.5

## 2020-03-29 NOTE — ED Triage Notes (Signed)
Patient here from home with complaints of right ear pain x1 month. Antibiotics and "ear drops" with no relief.

## 2020-03-29 NOTE — ED Provider Notes (Signed)
[facility] DEPT Provider Note   CSN: [id number] Arrival date & time: 03/29/20  1839     History Chief Complaint  Patient presents with  . Otalgia    Aaron Dixon is a 49 y.o. male with a history of tobacco abuse who presents to the emergency department with complaints of right ear pain for the past 1 month.  Patient states the pain is a constant pressure/fullness sensation with muffled hearing with intermittent increases in pain.  No specific alleviating or aggravating factors.  Has had a little bit of drainage from the ear.  He was seen by an urgent care and given oral antibiotics and eardrops without much relief.  Denies recent ear trauma or barotrauma.  Denies fever, chills, sore throat, nasal congestion.  He states he has a history of similar symptoms that felt better after ear irrigation in the past.  HPI     Past Medical History:  Diagnosis Date  . Chronic pain   . Kidney stones   . Opiate addiction ([facility])   . Psoriatic arthritis [facility])     Patient Active Problem List   Diagnosis Date Noted  . Lower back injury 05/26/2015  . Right shoulder injury 01/21/2015  . Tobacco abuse 04/28/2013  . Psoriasis 04/28/2013    History reviewed. No pertinent surgical history.     History reviewed. No pertinent family history.  Social History   Tobacco Use  . Smoking status: Current Every Day Smoker    Packs/day: 1.00    Types: Cigarettes  . Smokeless tobacco: Never Used  Substance Use Topics  . Alcohol use: No    Alcohol/week: 0.0 standard drinks  . Drug use: No    Home Medications Prior to Admission medications   Medication Sig Start Date End Date Taking? Authorizing Provider  buprenorphine-naloxone (SUBOXONE) 2-0.5 mg SUBL SL tablet Place 1 tablet under the tongue daily as needed (opioid dependence).     [provider]  ibuprofen (ADVIL,MOTRIN) 600 MG tablet Take 1 tablet (600 mg total) by mouth every 6 (six) hours as needed.  01/08/19   Charlann Lange, PA-C  naproxen (NAPROSYN) 500 MG tablet Take 1 tablet (500 mg total) by mouth 2 (two) times daily. Patient not taking: Reported on 01/08/2019 02/28/18   Monifah Freehling, Glynda Jaeger, PA-C    Allergies    Patient has no known allergies.  Review of Systems   Review of Systems  Constitutional: Negative for chills and fever.  HENT: Positive for ear discharge, ear pain and hearing loss (Muffled). Negative for congestion, sore throat, trouble swallowing and voice change.   Respiratory: Negative for cough and shortness of breath.   Cardiovascular: Negative for chest pain.  Gastrointestinal: Negative for abdominal pain and vomiting.    Physical Exam Updated Vital Signs BP (!) 146/85 (BP Location: Left Arm)   Pulse 67   Temp 98.1 F (36.7 C) (Oral)   Resp 16   SpO2 98%   Physical Exam Vitals and nursing note reviewed.  Constitutional:      General: He is not in acute distress.    Appearance: He is well-developed. He is not toxic-appearing.  HENT:     Head: Normocephalic and atraumatic.     Right Ear: Tympanic membrane is not perforated, erythematous, retracted or bulging.     Left Ear: Tympanic membrane is not perforated, erythematous, retracted or bulging.     Ears:     Comments: Right ear AC with some mild drainage which limits visualization of  the TM. No erythema/swellng/tenderness to the mastoid.  No erythema/swelling noted to the external ear.  Patient has tenderness palpation to the right tragal area.    Nose: Congestion present.     Right Sinus: No maxillary sinus tenderness or frontal sinus tenderness.     Left Sinus: No maxillary sinus tenderness or frontal sinus tenderness.     Mouth/Throat:     Pharynx: Oropharynx is clear. Uvula midline. No oropharyngeal exudate or posterior oropharyngeal erythema.     Comments: Posterior oropharynx is symmetric appearing. Patient tolerating own secretions without difficulty. No trismus. No drooling. No hot potato  voice. No swelling beneath the tongue, submandibular compartment is soft.  Eyes:     General:        Right eye: No discharge.        Left eye: No discharge.     Conjunctiva/sclera: Conjunctivae normal.  Cardiovascular:     Rate and Rhythm: Normal rate and regular rhythm.  Pulmonary:     Effort: Pulmonary effort is normal. No respiratory distress.     Breath sounds: Normal breath sounds. No wheezing, rhonchi or rales.  Abdominal:     General: There is no distension.     Palpations: Abdomen is soft.     Tenderness: There is no abdominal tenderness.  Musculoskeletal:     Cervical back: Neck supple. No rigidity.  Lymphadenopathy:     Cervical: No cervical adenopathy.  Skin:    General: Skin is warm and dry.     Findings: No rash.  Neurological:     Mental Status: He is alert.  Psychiatric:        Behavior: Behavior normal.     ED Results / Procedures / Treatments   Labs (all labs ordered are listed, but only abnormal results are displayed) Labs Reviewed - No data to display  EKG None  Radiology No results found.  Procedures Procedures (including critical care time)  Medications Ordered in ED Medications  ciprofloxacin-dexamethasone (CIPRODEX) 0.3-0.1 % OTIC (EAR) suspension 4 drop (has no administration in time range)    ED Course  I have reviewed the triage vital signs and the nursing notes.  Pertinent labs & imaging results that were available during my care of the patient were reviewed by me and considered in my medical decision making (see chart for details).    MDM Rules/Calculators/A&P                      Patient presents to the emergency department with 1 month of right ear pain.  He is nontoxic, resting comfortably, vitals WNL with exception of elevated blood pressure, doubt HTN emergency.  He has no signs of mastoiditis.  There is no meningismus.  He does have some findings suspicious for an otitis externa, not consistent with a malignant otitis externa,  given his tragal tenderness and a bit of drainage within the EAC.  Given he has responded well to ear irrigation this was performed with subsequent better visualization of the TM, no obvious perforation or findings of AOM on reassessment.  TM does appear a bit full though.  Nasal congestion present.  He did have symptomatic improvement following irrigation.  Will cover for otitis externa with Ciprodex drops provided in the ER.  We will also give Flonase to help with possible congestion and eustachian tube dysfunction PCP follow-up.  Given duration of symptoms we will also provide ENT information. I discussed  treatment plan, need for follow-up, and return precautions with  the patient. Provided opportunity for questions, patient confirmed understanding and is in agreement with plan.   Final Clinical Impression(s) / ED Diagnoses Final diagnoses:  Right ear pain    Rx / DC Orders ED Discharge Orders         Ordered    fluticasone (FLONASE) 50 MCG/ACT nasal spray  Daily PRN     03/29/20 2024           Cherly Anderson, PA-C 03/29/20 2024    Charlynne Pander, MD 04/02/20 [phone]

## 2020-03-29 NOTE — Discharge Instructions (Signed)
You were seen in the emergency department today for ear pain.  We are sending you home with the following medicines:  Ciprodex drops- use 4 drops two times per day for 1 week-this is to treat for possible external ear infection.  Flonase: Use 1 spray per nostril daily as needed for congestion/ear fullness.  This is to try to help with fluid behind the ear.  .We have prescribed you new medication(s) today. Discuss the medications prescribed today with your pharmacist as they can have adverse effects and interactions with your other medicines including over the counter and prescribed medications. Seek medical evaluation if you start to experience new or abnormal symptoms after taking one of these medicines, seek care immediately if you start to experience difficulty breathing, feeling of your throat closing, facial swelling, or rash as these could be indications of a more serious allergic reaction

## 2020-09-27 ENCOUNTER — Encounter (HOSPITAL_COMMUNITY): Payer: Self-pay

## 2020-09-27 ENCOUNTER — Other Ambulatory Visit: Payer: Self-pay

## 2020-09-27 ENCOUNTER — Emergency Department (HOSPITAL_COMMUNITY)
Admission: EM | Admit: 2020-09-27 | Discharge: 2020-09-27 | Disposition: A | Discharge status: Home / Self Care | Payer: PRIVATE HEALTH INSURANCE | Attending: Emergency Medicine | Admitting: Emergency Medicine

## 2020-09-27 DIAGNOSIS — H6011 Cellulitis of right external ear: Secondary | ICD-10-CM | POA: Insufficient documentation

## 2020-09-27 DIAGNOSIS — F17210 Nicotine dependence, cigarettes, uncomplicated: Secondary | ICD-10-CM | POA: Diagnosis not present

## 2020-09-27 DIAGNOSIS — R21 Rash and other nonspecific skin eruption: Secondary | ICD-10-CM | POA: Diagnosis present

## 2020-09-27 DIAGNOSIS — L0390 Cellulitis, unspecified: Secondary | ICD-10-CM

## 2020-09-27 MED ORDER — CLINDAMYCIN HCL 300 MG PO CAPS: 300.0000 mg | ORAL_CAPSULE | Freq: Three times a day (TID) | ORAL | 0 refills | Status: AC

## 2020-09-27 NOTE — Discharge Instructions (Addendum)
Follow up with your primary care doctor.

## 2020-09-27 NOTE — ED Provider Notes (Signed)
[facility]-EMERGENCY DEPT Provider Note   CSN: [id number] Arrival date & time: 09/27/20  1846     History Chief Complaint  Patient presents with   Rash    Aaron Dixon is a 49 y.o. male.  The history is provided by the patient.  Rash Location: behind right ear. Severity:  Mild Onset quality:  Gradual Timing:  Constant Progression:  Worsening Chronicity:  Recurrent Relieved by:  Nothing Worsened by:  Nothing Associated symptoms: no abdominal pain, no fever and no sore throat        Past Medical History:  Diagnosis Date   Chronic pain    Kidney stones    Opiate addiction ([facility])    Psoriatic arthritis [facility])     Patient Active Problem List   Diagnosis Date Noted   Lower back injury 05/26/2015   Right shoulder injury 01/21/2015   Tobacco abuse 04/28/2013   Psoriasis 04/28/2013    History reviewed. No pertinent surgical history.     History reviewed. No pertinent family history.  Social History   Tobacco Use   Smoking status: Current Every Day Smoker    Packs/day: 1.00    Types: Cigarettes   Smokeless tobacco: Never Used  Substance Use Topics   Alcohol use: No    Alcohol/week: 0.0 standard drinks   Drug use: No    Home Medications Prior to Admission medications   Medication Sig Start Date End Date Taking? Authorizing Provider  buprenorphine-naloxone (SUBOXONE) 2-0.5 mg SUBL SL tablet Place 1 tablet under the tongue daily as needed (opioid dependence).     [provider]  clindamycin (CLEOCIN) 300 MG capsule Take 1 capsule (300 mg total) by mouth 3 (three) times daily for 10 days. 09/27/20 10/07/20  Romonia Yanik, DO  fluticasone (FLONASE) 50 MCG/ACT nasal spray Place 1 spray into both nostrils daily as needed for allergies or rhinitis (ear fullness). 03/29/20   Petrucelli, Samantha R, PA-C  ibuprofen (ADVIL,MOTRIN) 600 MG tablet Take 1 tablet (600 mg total) by mouth every 6 (six) hours as needed. 01/08/19    Elpidio Anis, PA-C  naproxen (NAPROSYN) 500 MG tablet Take 1 tablet (500 mg total) by mouth 2 (two) times daily. Patient not taking: Reported on 01/08/2019 02/28/18   Petrucelli, Pleas Koch, PA-C    Allergies    Patient has no known allergies.  Review of Systems   Review of Systems  Constitutional: Negative for fever.  HENT: Positive for ear pain. Negative for congestion, dental problem, drooling, ear discharge, nosebleeds, sore throat and tinnitus.   Gastrointestinal: Negative for abdominal pain.  Skin: Positive for color change and rash. Negative for pallor and wound.    Physical Exam Updated Vital Signs BP (!) 165/102 (BP Location: Left Arm)    Pulse 71    Temp 97.6 F (36.4 C) (Oral)    Resp 17    SpO2 95%   Physical Exam HENT:     Head: Normocephalic and atraumatic.     Comments: Cellulitic type rash to the back of the right ear extending up into the scalp with no fluctuance or crepitus    Right Ear: Tympanic membrane and ear canal normal.     Left Ear: Tympanic membrane and ear canal normal.     Nose: Nose normal.     Mouth/Throat:     Mouth: Mucous membranes are dry.  Eyes:     Extraocular Movements: Extraocular movements intact.     Pupils: Pupils are equal, round, and reactive to  light.  Neurological:     Mental Status: He is alert.     ED Results / Procedures / Treatments   Labs (all labs ordered are listed, but only abnormal results are displayed) Labs Reviewed - No data to display  EKG None  Radiology No results found.  Procedures Procedures (including critical care time)  Medications Ordered in ED Medications - No data to display  ED Course  I have reviewed the triage vital signs and the nursing notes.  Pertinent labs & imaging results that were available during my care of the patient were reviewed by me and considered in my medical decision making (see chart for details).    MDM Rules/Calculators/A&P                          Aaron Dixon is a 48 year old male who presents to the ED with rash behind the right ear.  Has had a lot of itching and scratching to the back of his right ear.  Appears to have a cellulitic type rash of the scalp and skin behind the right ear.  No concern for mastoiditis.  TMs are unremarkable.  No signs of otitis externa.  Overall appears to be a bad cellulitis/folliculitis.  Will prescribe antibiotics and have him follow-up with primary care doctor.  Understands return precautions.  This chart was dictated using voice recognition software.  Despite best efforts to proofread,  errors can occur which can change the documentation meaning.    Final Clinical Impression(s) / ED Diagnoses Final diagnoses:  Cellulitis, unspecified cellulitis site    Rx / DC Orders ED Discharge Orders         Ordered    clindamycin (CLEOCIN) 300 MG capsule  3 times daily        09/27/20 1914           Virgina Norfolk, DO 09/27/20 1916

## 2020-09-27 NOTE — ED Triage Notes (Signed)
Pt arrived via walk in, c/o right sided external and internal ear pain x3 days. Denies any other issues. States this has been ongoing an  issue for months. Given abx for external irritation by derm with no relief.

## 2020-12-03 ENCOUNTER — Other Ambulatory Visit: Payer: Self-pay

## 2020-12-03 ENCOUNTER — Encounter (HOSPITAL_COMMUNITY): Payer: Self-pay

## 2020-12-03 ENCOUNTER — Emergency Department (HOSPITAL_COMMUNITY): Payer: 59

## 2020-12-03 ENCOUNTER — Emergency Department (HOSPITAL_COMMUNITY)
Admission: EM | Admit: 2020-12-03 | Discharge: 2020-12-04 | Disposition: A | Discharge status: Home / Self Care | Payer: 59 | Attending: Emergency Medicine | Admitting: Emergency Medicine

## 2020-12-03 DIAGNOSIS — F17210 Nicotine dependence, cigarettes, uncomplicated: Secondary | ICD-10-CM | POA: Insufficient documentation

## 2020-12-03 DIAGNOSIS — U071 COVID-19: Secondary | ICD-10-CM | POA: Insufficient documentation

## 2020-12-03 MED ORDER — ACETAMINOPHEN 500 MG PO TABS
1000.0000 mg | ORAL_TABLET | Freq: Once | ORAL | Status: AC
  Administered 2020-12-04: 1000 mg via ORAL
  Filled 2020-12-03: qty 2

## 2020-12-03 NOTE — ED Triage Notes (Signed)
Pt presents with c/o covid positive by a home test. Pt reports chills and headache.

## 2020-12-03 NOTE — ED Provider Notes (Signed)
TIME SEEN: 11:44 PM  CHIEF COMPLAINT: COVID-19  HPI: Patient is a 49 year old male who presents to the emergency department with COVID-19.  He states that yesterday he developed headache, chills, body aches.  States he took a home test and was positive for COVID-19.  He has received his COVID-19 vaccinations.  He denies any cough, shortness of breath, chest pain, vomiting, diarrhea, loss of taste or smell.  He is a smoker.  ROS: See HPI Constitutional: no fever  Eyes: no drainage  ENT: no runny nose   Cardiovascular:  no chest pain  Resp: no SOB  GI: no vomiting GU: no dysuria Integumentary: no rash  Allergy: no hives  Musculoskeletal: no leg swelling  Neurological: no slurred speech ROS otherwise negative  PAST MEDICAL HISTORY/PAST SURGICAL HISTORY:  Past Medical History:  Diagnosis Date  . Chronic pain   . Kidney stones   . Opiate addiction ([facility])   . Psoriatic arthritis ([facility])     MEDICATIONS:  Prior to Admission medications   Medication Sig Start Date End Date Taking? Authorizing Provider  buprenorphine-naloxone (SUBOXONE) 2-0.5 mg SUBL SL tablet Place 1 tablet under the tongue daily as needed (opioid dependence).     [provider]  fluticasone (FLONASE) 50 MCG/ACT nasal spray Place 1 spray into both nostrils daily as needed for allergies or rhinitis (ear fullness). 03/29/20   Petrucelli, Samantha R, PA-C  ibuprofen (ADVIL,MOTRIN) 600 MG tablet Take 1 tablet (600 mg total) by mouth every 6 (six) hours as needed. 01/08/19   Elpidio Anis, PA-C  naproxen (NAPROSYN) 500 MG tablet Take 1 tablet (500 mg total) by mouth 2 (two) times daily. Patient not taking: Reported on 01/08/2019 02/28/18   Petrucelli, Pleas Koch, PA-C    ALLERGIES:  No Known Allergies  SOCIAL HISTORY:  Social History   Tobacco Use  . Smoking status: Current Every Day Smoker    Packs/day: 1.00    Types: Cigarettes  . Smokeless tobacco: Never Used  Substance Use Topics  . Alcohol use: No     Alcohol/week: 0.0 standard drinks    FAMILY HISTORY: History reviewed. No pertinent family history.  EXAM: BP (!) 159/104   Pulse 73   Temp 99 F (37.2 C) (Oral)   Resp 11   SpO2 91%  CONSTITUTIONAL: Alert and oriented and responds appropriately to questions. Well-appearing; well-nourished, afebrile, nontoxic in appearance, well-hydrated HEAD: Normocephalic EYES: Conjunctivae clear, pupils appear equal, EOM appear intact ENT: normal nose; moist mucous membranes NECK: Supple, normal ROM CARD: RRR; S1 and S2 appreciated; no murmurs, no clicks, no rubs, no gallops RESP: Normal chest excursion without splinting or tachypnea; breath sounds clear and equal bilaterally; no wheezes, no rhonchi, no rales, no hypoxia or respiratory distress, speaking full sentences ABD/GI: Normal bowel sounds; non-distended; soft, non-tender, no rebound, no guarding, no peritoneal signs, no hepatosplenomegaly BACK:  The back appears normal EXT: Normal ROM in all joints; no deformity noted, no edema; no cyanosis SKIN: Normal color for age and race; warm; no rash on exposed skin NEURO: Moves all extremities equally PSYCH: The patient's mood and manner are appropriate.   MEDICAL DECISION MAKING: Patient here with symptoms of COVID-19 with a in-home test that was positive.  He is vaccinated.  He does have intermittent sats of 90% to 92% on room air at rest but he denies shortness of breath and his lungs are clear.  Will obtain labs, chest x-ray.  Will give Tylenol here for headache.  ED PROGRESS: Patient reports feeling better.  Labs reassuring.  Chest x-ray clear.  Able to ambulate throughout the room and sats stay between 94 to 97%.  No hypoxia seen here in the emergency department.  I feel he is safe for discharge home.  Given his BMI is 30, I have sent a message to the MAB infusion clinic to get him set up for outpatient MAB infusion as he states he would be interested in this.  I do not feel this needs to be done  from the emergency department.  He is on day 2 of symptoms at this time.  Discussed return precautions.  Discussed supportive care instructions.  Discussed quarantining.  Patient is comfortable with this plan.  At this time, I do not feel there is any life-threatening condition present. I have reviewed, interpreted and discussed all results (EKG, imaging, lab, urine as appropriate) and exam findings with patient/family. I have reviewed nursing notes and appropriate previous records.  I feel the patient is safe to be discharged home without further emergent workup and can continue workup as an outpatient as needed. Discussed usual and customary return precautions. Patient/family verbalize understanding and are comfortable with this plan.  Outpatient follow-up has been provided as needed. All questions have been answered.   BOMANI OOMMEN was evaluated in Emergency Department on 12/03/2020 for the symptoms described in the history of present illness. He was evaluated in the context of the global COVID-19 pandemic, which necessitated consideration that the patient might be at risk for infection with the SARS-CoV-2 virus that causes COVID-19. Institutional protocols and algorithms that pertain to the evaluation of patients at risk for COVID-19 are in a state of rapid change based on information released by regulatory bodies including the CDC and federal and state organizations. These policies and algorithms were followed during the patient's care in the ED.      Kasey Ewings, Layla Maw, DO 12/04/20 [phone]

## 2020-12-04 ENCOUNTER — Telehealth: Payer: Self-pay | Admitting: General Practice

## 2020-12-04 LAB — COMPREHENSIVE METABOLIC PANEL
ALT: 39 U/L (ref 0–44)
AST: 42 U/L — ABNORMAL HIGH (ref 15–41)
Albumin: 4 g/dL (ref 3.5–5.0)
Alkaline Phosphatase: 54 U/L (ref 38–126)
Anion gap: 9 (ref 5–15)
BUN: 17 mg/dL (ref 6–20)
CO2: 27 mmol/L (ref 22–32)
Calcium: 9.1 mg/dL (ref 8.9–10.3)
Chloride: 99 mmol/L (ref 98–111)
Creatinine, Ser: 1.02 mg/dL (ref 0.61–1.24)
GFR, Estimated: >60 mL/min (ref >60)
Glucose, Bld: 112 mg/dL — ABNORMAL HIGH (ref 70–99)
Potassium: 4.1 mmol/L (ref 3.5–5.1)
Sodium: 135 mmol/L (ref 135–145)
Total Bilirubin: 0.5 mg/dL (ref 0.3–1.2)
Total Protein: 7.1 g/dL (ref 6.5–8.1)

## 2020-12-04 LAB — CBC WITH DIFFERENTIAL/PLATELET
Abs Immature Granulocytes: 0.03 10*3/uL (ref 0.00–0.07)
Basophils Absolute: 0 10*3/uL (ref 0.0–0.1)
Basophils Relative: 1 %
Eosinophils Absolute: 0.1 10*3/uL (ref 0.0–0.5)
Eosinophils Relative: 1 %
HCT: 46.4 % (ref 39.0–52.0)
Hemoglobin: 15.5 g/dL (ref 13.0–17.0)
Immature Granulocytes: 1 %
Lymphocytes Relative: 35 %
Lymphs Abs: 2 10*3/uL (ref 0.7–4.0)
MCH: 29.6 pg (ref 26.0–34.0)
MCHC: 33.4 g/dL (ref 30.0–36.0)
MCV: 88.5 fL (ref 80.0–100.0)
Monocytes Absolute: 1 10*3/uL (ref 0.1–1.0)
Monocytes Relative: 16 %
Neutro Abs: 2.7 10*3/uL (ref 1.7–7.7)
Neutrophils Relative %: 46 %
Platelets: 155 10*3/uL (ref 150–400)
RBC: 5.24 MIL/uL (ref 4.22–5.81)
RDW: 14.7 % (ref 11.5–15.5)
WBC: 5.8 10*3/uL (ref 4.0–10.5)
nRBC: 0 % (ref 0.0–0.2)

## 2020-12-04 LAB — RESP PANEL BY RT-PCR (FLU A&B, COVID) ARPGX2
Influenza A by PCR: NEGATIVE
Influenza B by PCR: NEGATIVE
SARS Coronavirus 2 by RT PCR: POSITIVE — AB

## 2020-12-04 NOTE — ED Notes (Signed)
Ambulated with steady gait. O2 remains between 94-97% on RA.

## 2020-12-04 NOTE — Telephone Encounter (Signed)
error:[id number] ° °

## 2020-12-04 NOTE — Discharge Instructions (Addendum)
You may alternate Tylenol 1000 mg every 6 hours as needed for pain, fever and Ibuprofen 800 mg every 8 hours as needed for pain, fever.  Please take Ibuprofen with food.  Do not take more than 4000 mg of Tylenol (acetaminophen) in a 24 hour period.  You will be contacted by the monoclonal antibody infusion clinic about setting up an appointment for monoclonal antibody infusion.  Please return to the emergency department if you begin having confusion, shortness of breath, have oxygen saturation of 89% or less on pulse oximetry, vomiting and cannot stop, chest pain.  I recommend quarantining for at least 7 days after onset of symptoms and you will need to be fever free without using Tylenol or ibuprofen for at least 48 hours before coming out of quarantine and your symptoms will need to have resolved or significantly improved (other than loss of taste and smell).

## 2020-12-06 ENCOUNTER — Other Ambulatory Visit: Payer: Self-pay | Admitting: Nurse Practitioner

## 2020-12-06 ENCOUNTER — Telehealth (HOSPITAL_COMMUNITY): Payer: Self-pay

## 2020-12-06 DIAGNOSIS — U071 COVID-19: Secondary | ICD-10-CM

## 2020-12-06 DIAGNOSIS — Z720 Tobacco use: Secondary | ICD-10-CM

## 2020-12-06 NOTE — Progress Notes (Signed)
I connected by phone with Aaron Dixon on 12/06/2020 at 1:23 PM to discuss the potential use of a new treatment for mild to moderate COVID-19 viral infection in non-hospitalized patients.  This patient is a 49 y.o. male that meets the FDA criteria for Emergency Use Authorization of COVID monoclonal antibody casirivimab/imdevimab, bamlanivimab/etesevimab, or sotrovimab.  Has a (+) direct SARS-CoV-2 viral test result  Has mild or moderate COVID-19   Is NOT hospitalized due to COVID-19  Is within 10 days of symptom onset  Has at least one of the high risk factor(s) for progression to severe COVID-19 and/or hospitalization as defined in EUA.  Specific high risk criteria : BMI > 25 and Other high risk medical condition per CDC:  smoker    I have spoken and communicated the following to the patient or parent/caregiver regarding COVID monoclonal antibody treatment:  1. FDA has authorized the emergency use for the treatment of mild to moderate COVID-19 in adults and pediatric patients with positive results of direct SARS-CoV-2 viral testing who are 23 years of age and older weighing at least 40 kg, and who are at high risk for progressing to severe COVID-19 and/or hospitalization.  2. The significant known and potential risks and benefits of COVID monoclonal antibody, and the extent to which such potential risks and benefits are unknown.  3. Information on available alternative treatments and the risks and benefits of those alternatives, including clinical trials.  4. Patients treated with COVID monoclonal antibody should continue to self-isolate and use infection control measures (e.g., wear mask, isolate, social distance, avoid sharing personal items, clean and disinfect "high touch" surfaces, and frequent handwashing) according to CDC guidelines.   5. The patient or parent/caregiver has the option to accept or refuse COVID monoclonal antibody treatment.  After reviewing this information  with the patient, the patient has agreed to receive one of the available covid 19 monoclonal antibodies and will be provided an appropriate fact sheet prior to infusion. Mayra Reel, NP 12/06/2020 1:23 PM

## 2020-12-06 NOTE — Telephone Encounter (Signed)
  Called to Discuss with patient about Covid symptoms and the use of the monoclonal antibody infusion for those with mild to moderate Covid symptoms and at a high risk of hospitalization.     Pt appears to qualify for this infusion due to co-morbid conditions and/or a member of an at-risk group in accordance with the FDA Emergency Use Authorization.    Pt stated his symptoms started on 12/15, he tested positive on 12/17 at a [facility] facility. Pt c/o tiredness, body aches, slight headache. Pt states he is a current smoker, has had 2 COVID vaccines. RN informed pt and APP will be calling to verify information and if he qualifies will set up with an appointment.

## 2020-12-07 ENCOUNTER — Ambulatory Visit (HOSPITAL_COMMUNITY)
Admission: RE | Admit: 2020-12-07 | Discharge: 2020-12-07 | Disposition: A | Discharge status: Home / Self Care | Payer: 59 | Source: Ambulatory Visit | Attending: Pulmonary Disease | Admitting: Pulmonary Disease

## 2020-12-07 DIAGNOSIS — U071 COVID-19: Secondary | ICD-10-CM | POA: Insufficient documentation

## 2020-12-07 DIAGNOSIS — Z720 Tobacco use: Secondary | ICD-10-CM | POA: Diagnosis present

## 2020-12-07 MED ORDER — ALBUTEROL SULFATE HFA 108 (90 BASE) MCG/ACT IN AERS: 2.0000 | INHALATION_SPRAY | Freq: Once | RESPIRATORY_TRACT | Status: DC | PRN

## 2020-12-07 MED ORDER — SODIUM CHLORIDE 0.9 % IV SOLN: Freq: Once | INTRAVENOUS | Status: AC

## 2020-12-07 MED ORDER — SODIUM CHLORIDE 0.9 % IV SOLN: INTRAVENOUS | Status: DC | PRN

## 2020-12-07 MED ORDER — DIPHENHYDRAMINE HCL 50 MG/ML IJ SOLN: 50.0000 mg | Freq: Once | INTRAMUSCULAR | Status: DC | PRN

## 2020-12-07 MED ORDER — METHYLPREDNISOLONE SODIUM SUCC 125 MG IJ SOLR: 125.0000 mg | Freq: Once | INTRAMUSCULAR | Status: DC | PRN

## 2020-12-07 MED ORDER — FAMOTIDINE IN NACL 20-0.9 MG/50ML-% IV SOLN: 20.0000 mg | Freq: Once | INTRAVENOUS | Status: DC | PRN

## 2020-12-07 MED ORDER — EPINEPHRINE 0.3 MG/0.3ML IJ SOAJ: 0.3000 mg | Freq: Once | INTRAMUSCULAR | Status: DC | PRN

## 2020-12-07 NOTE — Progress Notes (Signed)
Patient reviewed Fact Sheet for Patients, Parents, and Caregivers for Emergency Use Authorization (EUA) of bamlanivimab and etesevimab for the Treatment of Coronavirus. Patient also reviewed and is agreeable to the estimated cost of treatment. Patient is agreeable to proceed.   

## 2020-12-07 NOTE — Progress Notes (Signed)
  Diagnosis: COVID-19  Physician: Dr. Wright   Procedure: Covid Infusion Clinic Med: bamlanivimab\etesevimab infusion - Provided patient with bamlanimivab\etesevimab fact sheet for patients, parents and caregivers prior to infusion.  Complications: No immediate complications noted.  Discharge: Discharged home   Aaron Dixon 12/07/2020   

## 2020-12-07 NOTE — Discharge Instructions (Signed)
10 Things You Can Do to Manage Your COVID-19 Symptoms at Home If you have possible or confirmed COVID-19: 1. Stay home from work and school. And stay away from other public places. If you must go out, avoid using any kind of public transportation, ridesharing, or taxis. 2. Monitor your symptoms carefully. If your symptoms get worse, call your healthcare provider immediately. 3. Get rest and stay hydrated. 4. If you have a medical appointment, call the healthcare provider ahead of time and tell them that you have or may have COVID-19. 5. For medical emergencies, call 911 and notify the dispatch personnel that you have or may have COVID-19. 6. Cover your cough and sneezes with a tissue or use the inside of your elbow. 7. Wash your hands often with soap and water for at least 20 seconds or clean your hands with an alcohol-based hand sanitizer that contains at least 60% alcohol. 8. As much as possible, stay in a specific room and away from other people in your home. Also, you should use a separate bathroom, if available. If you need to be around other people in or outside of the home, wear a mask. 9. Avoid sharing personal items with other people in your household, like dishes, towels, and bedding. 10. Clean all surfaces that are touched often, like counters, tabletops, and doorknobs. Use household cleaning sprays or wipes according to the label instructions. cdc.gov/coronavirus 06/19/2019 This information is not intended to replace advice given to you by your health care provider. Make sure you discuss any questions you have with your health care provider. Document Revised: 11/21/2019 Document Reviewed: 11/21/2019 Elsevier Patient Education  2020 Elsevier Inc. What types of side effects do monoclonal antibody drugs cause?  Common side effects  In general, the more common side effects caused by monoclonal antibody drugs include: . Allergic reactions, such as hives or itching . Flu-like signs and  symptoms, including chills, fatigue, fever, and muscle aches and pains . Nausea, vomiting . Diarrhea . Skin rashes . Low blood pressure   The CDC is recommending patients who receive monoclonal antibody treatments wait at least 90 days before being vaccinated.  Currently, there are no data on the safety and efficacy of mRNA COVID-19 vaccines in persons who received monoclonal antibodies or convalescent plasma as part of COVID-19 treatment. Based on the estimated half-life of such therapies as well as evidence suggesting that reinfection is uncommon in the 90 days after initial infection, vaccination should be deferred for at least 90 days, as a precautionary measure until additional information becomes available, to avoid interference of the antibody treatment with vaccine-induced immune responses. If you have any questions or concerns after the infusion please call the Advanced Practice Provider on call at [phone]. This number is ONLY intended for your use regarding questions or concerns about the infusion post-treatment side-effects.  Please do not provide this number to others for use. For return to work notes please contact your primary care provider.   If someone you know is interested in receiving treatment please have them call the COVID hotline at [phone].   

## 2022-03-21 ENCOUNTER — Emergency Department (HOSPITAL_BASED_OUTPATIENT_CLINIC_OR_DEPARTMENT_OTHER): Payer: 59 | Admitting: Radiology

## 2022-03-21 ENCOUNTER — Emergency Department (HOSPITAL_BASED_OUTPATIENT_CLINIC_OR_DEPARTMENT_OTHER)
Admission: EM | Admit: 2022-03-21 | Discharge: 2022-03-21 | Disposition: A | Discharge status: Home / Self Care | Payer: 59 | Attending: Emergency Medicine | Admitting: Emergency Medicine

## 2022-03-21 ENCOUNTER — Other Ambulatory Visit: Payer: Self-pay

## 2022-03-21 ENCOUNTER — Encounter (HOSPITAL_BASED_OUTPATIENT_CLINIC_OR_DEPARTMENT_OTHER): Payer: Self-pay | Admitting: Emergency Medicine

## 2022-03-21 ENCOUNTER — Other Ambulatory Visit (HOSPITAL_BASED_OUTPATIENT_CLINIC_OR_DEPARTMENT_OTHER): Payer: Self-pay

## 2022-03-21 DIAGNOSIS — R0981 Nasal congestion: Secondary | ICD-10-CM | POA: Insufficient documentation

## 2022-03-21 DIAGNOSIS — F17210 Nicotine dependence, cigarettes, uncomplicated: Secondary | ICD-10-CM | POA: Insufficient documentation

## 2022-03-21 DIAGNOSIS — R059 Cough, unspecified: Secondary | ICD-10-CM | POA: Insufficient documentation

## 2022-03-21 DIAGNOSIS — R062 Wheezing: Secondary | ICD-10-CM | POA: Insufficient documentation

## 2022-03-21 DIAGNOSIS — R0600 Dyspnea, unspecified: Secondary | ICD-10-CM

## 2022-03-21 MED ORDER — ALBUTEROL SULFATE (2.5 MG/3ML) 0.083% IN NEBU: 5.0000 mg | INHALATION_SOLUTION | Freq: Once | RESPIRATORY_TRACT | Status: DC

## 2022-03-21 MED ORDER — PREDNISONE 50 MG PO TABS
ORAL_TABLET | ORAL | 0 refills | Status: AC
  Filled 2022-03-21: qty 5, 5d supply, fill #0

## 2022-03-21 MED ORDER — ALBUTEROL SULFATE HFA 108 (90 BASE) MCG/ACT IN AERS
1.0000 | INHALATION_SPRAY | Freq: Four times a day (QID) | RESPIRATORY_TRACT | 0 refills | Status: AC | PRN
  Filled 2022-03-21: qty 8.5, 25d supply, fill #0

## 2022-03-21 MED ORDER — IPRATROPIUM BROMIDE HFA 17 MCG/ACT IN AERS: 2.0000 | INHALATION_SPRAY | Freq: Once | RESPIRATORY_TRACT | Status: DC

## 2022-03-21 MED ORDER — ALBUTEROL SULFATE (2.5 MG/3ML) 0.083% IN NEBU
2.5000 mg | INHALATION_SOLUTION | Freq: Once | RESPIRATORY_TRACT | Status: AC
  Administered 2022-03-21: 2.5 mg via RESPIRATORY_TRACT
  Filled 2022-03-21: qty 3

## 2022-03-21 MED ORDER — IPRATROPIUM-ALBUTEROL 0.5-2.5 (3) MG/3ML IN SOLN
3.0000 mL | Freq: Once | RESPIRATORY_TRACT | Status: AC
  Administered 2022-03-21: 3 mL via RESPIRATORY_TRACT
  Filled 2022-03-21: qty 3

## 2022-03-21 MED ORDER — PREDNISONE 50 MG PO TABS
60.0000 mg | ORAL_TABLET | Freq: Once | ORAL | Status: AC
  Administered 2022-03-21: 60 mg via ORAL
  Filled 2022-03-21: qty 1

## 2022-03-21 NOTE — ED Triage Notes (Signed)
Pt arrives to ED with c/o shortness of breath. This started x2 days ago. Pt reports sinus congestion x1 week and productive cough x2 days ago.  ?

## 2022-03-21 NOTE — ED Provider Notes (Signed)
?[facility] EMERG[facility] DEPT ?Provider Note ? ? ?CSN: [id number] ?Arrival date & time: 03/21/22  1341 ? ?  ? ?History ? ?Chief Complaint  ?Patient presents with  ? Shortness of Breath  ? ? ?Aaron Dixon is a 51 y.o. male. ? ?51 year old male presents with cough congestion x10 days. he has heard some wheezing.  Does smoke a pack cigarettes a day.  No fever or chills.  Cough is been nonproductive.  No associated chest pain or chest pressure.  Some URI symptoms but they are resolving.  Denies any CHF symptoms.  No treatment use prior to arrival.  Denies any history of COPD ? ? ?  ? ?Home Medications ?Prior to Admission medications   ?Medication Sig Start Date End Date Taking? Authorizing Provider  ?buprenorphine-naloxone (SUBOXONE) 2-0.5 mg SUBL SL tablet Place 1 tablet under the tongue daily as needed (opioid dependence).     [provider]  ?fluticasone (FLONASE) 50 MCG/ACT nasal spray Place 1 spray into both nostrils daily as needed for allergies or rhinitis (ear fullness). 03/29/20   Petrucelli, Samantha R, PA-C  ?ibuprofen (ADVIL,MOTRIN) 600 MG tablet Take 1 tablet (600 mg total) by mouth every 6 (six) hours as needed. 01/08/19   Elpidio Anis, PA-C  ?naproxen (NAPROSYN) 500 MG tablet Take 1 tablet (500 mg total) by mouth 2 (two) times daily. ?Patient not taking: Reported on 01/08/2019 02/28/18   Petrucelli, Pleas Koch, PA-C  ?   ? ?Allergies    ?Patient has no known allergies.   ? ?Review of Systems   ?Review of Systems  ?All other systems reviewed and are negative. ? ?Physical Exam ?Updated Vital Signs ?BP (!) 162/108 (BP Location: Right Arm)   Pulse 66   Temp 98.1 ?F (36.7 ?C) (Oral)   Resp 14   Ht 1.778 m (5\' 10" )   Wt 102.1 kg   SpO2 96%   BMI 32.28 kg/m?  ?Physical Exam ?Vitals and nursing note reviewed.  ?Constitutional:   ?   General: He is not in acute distress. ?   Appearance: Normal appearance. He is well-developed. He is not toxic-appearing.  ?HENT:  ?   Head: Normocephalic  and atraumatic.  ?Eyes:  ?   General: Lids are normal.  ?   Conjunctiva/sclera: Conjunctivae normal.  ?   Pupils: Pupils are equal, round, and reactive to light.  ?Neck:  ?   Thyroid: No thyroid mass.  ?   Trachea: No tracheal deviation.  ?Cardiovascular:  ?   Rate and Rhythm: Normal rate and regular rhythm.  ?   Heart sounds: Normal heart sounds. No murmur heard. ?  No gallop.  ?Pulmonary:  ?   Effort: Pulmonary effort is normal. Prolonged expiration present. No respiratory distress.  ?   Breath sounds: No stridor. Decreased breath sounds and wheezing present. No rhonchi or rales.  ?Abdominal:  ?   General: There is no distension.  ?   Palpations: Abdomen is soft.  ?   Tenderness: There is no abdominal tenderness. There is no rebound.  ?Musculoskeletal:     ?   General: No tenderness. Normal range of motion.  ?   Cervical back: Normal range of motion and neck supple.  ?Skin: ?   General: Skin is warm and dry.  ?   Findings: No abrasion or rash.  ?Neurological:  ?   Mental Status: He is alert and oriented to person, place, and time. Mental status is at baseline.  ?   GCS: GCS eye subscore  is 4. GCS verbal subscore is 5. GCS motor subscore is 6.  ?   Cranial Nerves: No cranial nerve deficit.  ?   Sensory: No sensory deficit.  ?   Motor: Motor function is intact.  ?Psychiatric:     ?   Attention and Perception: Attention normal.     ?   Speech: Speech normal.     ?   Behavior: Behavior normal.  ? ? ?ED Results / Procedures / Treatments   ?Labs ?(all labs ordered are listed, but only abnormal results are displayed) ?Labs Reviewed - No data to display ? ?EKG ?EKG Interpretation ? ?Date/Time:  Monday March 21 2022 13:52:06 EDT ?Ventricular Rate:  67 ?PR Interval:  175 ?QRS Duration: 86 ?QT Interval:  383 ?QTC Calculation: 405 ?R Axis:   60 ?Text Interpretation: Sinus rhythm Confirmed by Lorre Nick ([id number]) on 03/21/2022 1:54:08 PM ? ?Radiology ?No results found. ? ?Procedures ?Procedures  ? ? ?Medications Ordered in  ED ?Medications  ?ipratropium-albuterol (DUONEB) 0.5-2.5 (3) MG/3ML nebulizer solution 3 mL (has no administration in time range)  ?albuterol (PROVENTIL) (2.5 MG/3ML) 0.083% nebulizer solution 2.5 mg (has no administration in time range)  ? ? ?ED Course/ Medical Decision Making/ A&P ?  ?                        ?Medical Decision Making ?Amount and/or Complexity of Data Reviewed ?Radiology: ordered. ? ?Risk ?Prescription drug management. ? ? ?Chest x-ray per my interpretation shows no sign of infiltrate.  Patient did have some wheezing and I suspect that bronchitis likely etiology of this.  Given albuterol with Atrovent and breathing has improved.  Patient medicated with prednisone as well 2.  Reassessed and feeling better patient be placed on 5 days of prednisone and given prescription for albuterol return precautions  ? ? ? ? ? ?Final Clinical Impression(s) / ED Diagnoses ?Final diagnoses:  ?None  ? ? ?Rx / DC Orders ?ED Discharge Orders   ? ? None  ? ?  ? ? ?  ?Lorre Nick, MD ?03/21/22 1454 ? ?

## 2022-06-26 ENCOUNTER — Emergency Department (HOSPITAL_BASED_OUTPATIENT_CLINIC_OR_DEPARTMENT_OTHER)
Admission: EM | Admit: 2022-06-26 | Discharge: 2022-06-26 | Disposition: A | Discharge status: Home / Self Care | Payer: 59 | Attending: Emergency Medicine | Admitting: Emergency Medicine

## 2022-06-26 ENCOUNTER — Other Ambulatory Visit: Payer: Self-pay

## 2022-06-26 ENCOUNTER — Encounter (HOSPITAL_BASED_OUTPATIENT_CLINIC_OR_DEPARTMENT_OTHER): Payer: Self-pay

## 2022-06-26 DIAGNOSIS — H9202 Otalgia, left ear: Secondary | ICD-10-CM | POA: Diagnosis present

## 2022-06-26 DIAGNOSIS — H60502 Unspecified acute noninfective otitis externa, left ear: Secondary | ICD-10-CM | POA: Insufficient documentation

## 2022-06-26 MED ORDER — NEOMYCIN-POLYMYXIN-HC 3.5-10000-1 OT SUSP: 4.0000 [drp] | Freq: Three times a day (TID) | OTIC | 0 refills | Status: AC

## 2022-06-26 NOTE — ED Triage Notes (Signed)
Patient here POV from Home  Endorses Itching to Right Ear for approximately 1 Month but states he seeks Evaluation due to Left Ear Pain.  No Drainage. No Known Fevers.   Did go Swimming yesterday.  NAD Noted during Triage. A&Ox4. GCS 15. Ambulatory.

## 2022-06-26 NOTE — Discharge Instructions (Signed)
Please use the eardrops as prescribed. 4 drops in the affected ear 3 daily for the next 7 days.

## 2022-06-26 NOTE — ED Provider Notes (Signed)
[facility] EMERGENCY DEPT Provider Note   CSN: [id number] Arrival date & time: 06/26/22  1550     History  Chief Complaint  Patient presents with   Otalgia    Aaron Dixon is a 51 y.o. male.   Otalgia  Patient is a 51 year old male presented emergency room today with left ear pain for the past few days.  States that he has had no drainage no fevers he states that he has been swimming occasionally over the summer most recently yesterday.  No other associated symptoms apart from some itching in his right ear.      Home Medications Prior to Admission medications   Medication Sig Start Date End Date Taking? Authorizing Provider  neomycin-polymyxin-hydrocortisone (CORTISPORIN) 3.5-10000-1 OTIC suspension Place 4 drops into both ears 3 (three) times daily. 06/26/22  Yes Riyanshi Wahab S, PA  albuterol (VENTOLIN HFA) 108 (90 Base) MCG/ACT inhaler Inhale 1-2 puffs into the lungs every 6 (six) hours as needed for wheezing or shortness of breath. 03/21/22   Lorre Nick, MD  buprenorphine-naloxone (SUBOXONE) 2-0.5 mg SUBL SL tablet Place 1 tablet under the tongue daily as needed (opioid dependence).     [provider]  fluticasone (FLONASE) 50 MCG/ACT nasal spray Place 1 spray into both nostrils daily as needed for allergies or rhinitis (ear fullness). 03/29/20   Petrucelli, Samantha R, PA-C  ibuprofen (ADVIL,MOTRIN) 600 MG tablet Take 1 tablet (600 mg total) by mouth every 6 (six) hours as needed. 01/08/19   Elpidio Anis, PA-C  naproxen (NAPROSYN) 500 MG tablet Take 1 tablet (500 mg total) by mouth 2 (two) times daily. Patient not taking: Reported on 01/08/2019 02/28/18   Petrucelli, Pleas Koch, PA-C  predniSONE (DELTASONE) 50 MG tablet Take 1 tablet by mouth once daily for 5 days 03/21/22   Lorre Nick, MD      Allergies    Patient has no known allergies.    Review of Systems   Review of Systems  HENT:  Positive for ear pain.     Physical Exam Updated  Vital Signs BP (!) 145/90 (BP Location: Right Arm)   Pulse 70   Temp 98 F (36.7 C) (Oral)   Resp 18   Ht 5\' 10"  (1.778 m)   Wt 102.1 kg   SpO2 97%   BMI 32.30 kg/m  Physical Exam Vitals and nursing note reviewed.  Constitutional:      General: He is not in acute distress.    Appearance: Normal appearance. He is not ill-appearing.  HENT:     Head: Normocephalic and atraumatic.     Right Ear: Tympanic membrane normal.     Left Ear: Tympanic membrane normal.     Ears:     Comments: Left EAC with macerated and edematous tissue.  Consistent with otitis externa Eyes:     General: No scleral icterus.       Right eye: No discharge.        Left eye: No discharge.     Conjunctiva/sclera: Conjunctivae normal.  Pulmonary:     Effort: Pulmonary effort is normal.     Breath sounds: No stridor.  Neurological:     Mental Status: He is alert and oriented to person, place, and time. Mental status is at baseline.     ED Results / Procedures / Treatments   Labs (all labs ordered are listed, but only abnormal results are displayed) Labs Reviewed - No data to display  EKG None  Radiology No results found.  Procedures Procedures    Medications Ordered in ED Medications - No data to display  ED Course/ Medical Decision Making/ A&P                           Medical Decision Making Risk Prescription drug management.   Patient is a 51 year old male presented emergency room today with left ear pain for the past few days.  States that he has had no drainage no fevers he states that he has been swimming occasionally over the summer most recently yesterday.  No other associated symptoms apart from some itching in his right ear.  Patient has acute otitis externa.  Will treat with Cortisporin otic suspension    Final Clinical Impression(s) / ED Diagnoses Final diagnoses:  Acute otitis externa of left ear, unspecified type    Rx / DC Orders ED Discharge Orders           Ordered    neomycin-polymyxin-hydrocortisone (CORTISPORIN) 3.5-10000-1 OTIC suspension  3 times daily        06/26/22 1809              Gailen Shelter, [place] 06/26/22 2124    Benjiman Core, MD 06/29/22 1427

## 2024-01-31 IMAGING — DX DG CHEST 2V
2 series · 2 of 2 positions shown · non-contrast
Comparison: 12/03/2020

CLINICAL DATA: Head cold last week, feels like it has moved into
his chest, shortness of breath and cough for 2 days, smoker

EXAM:
CHEST - 2 VIEW

[chest pa]
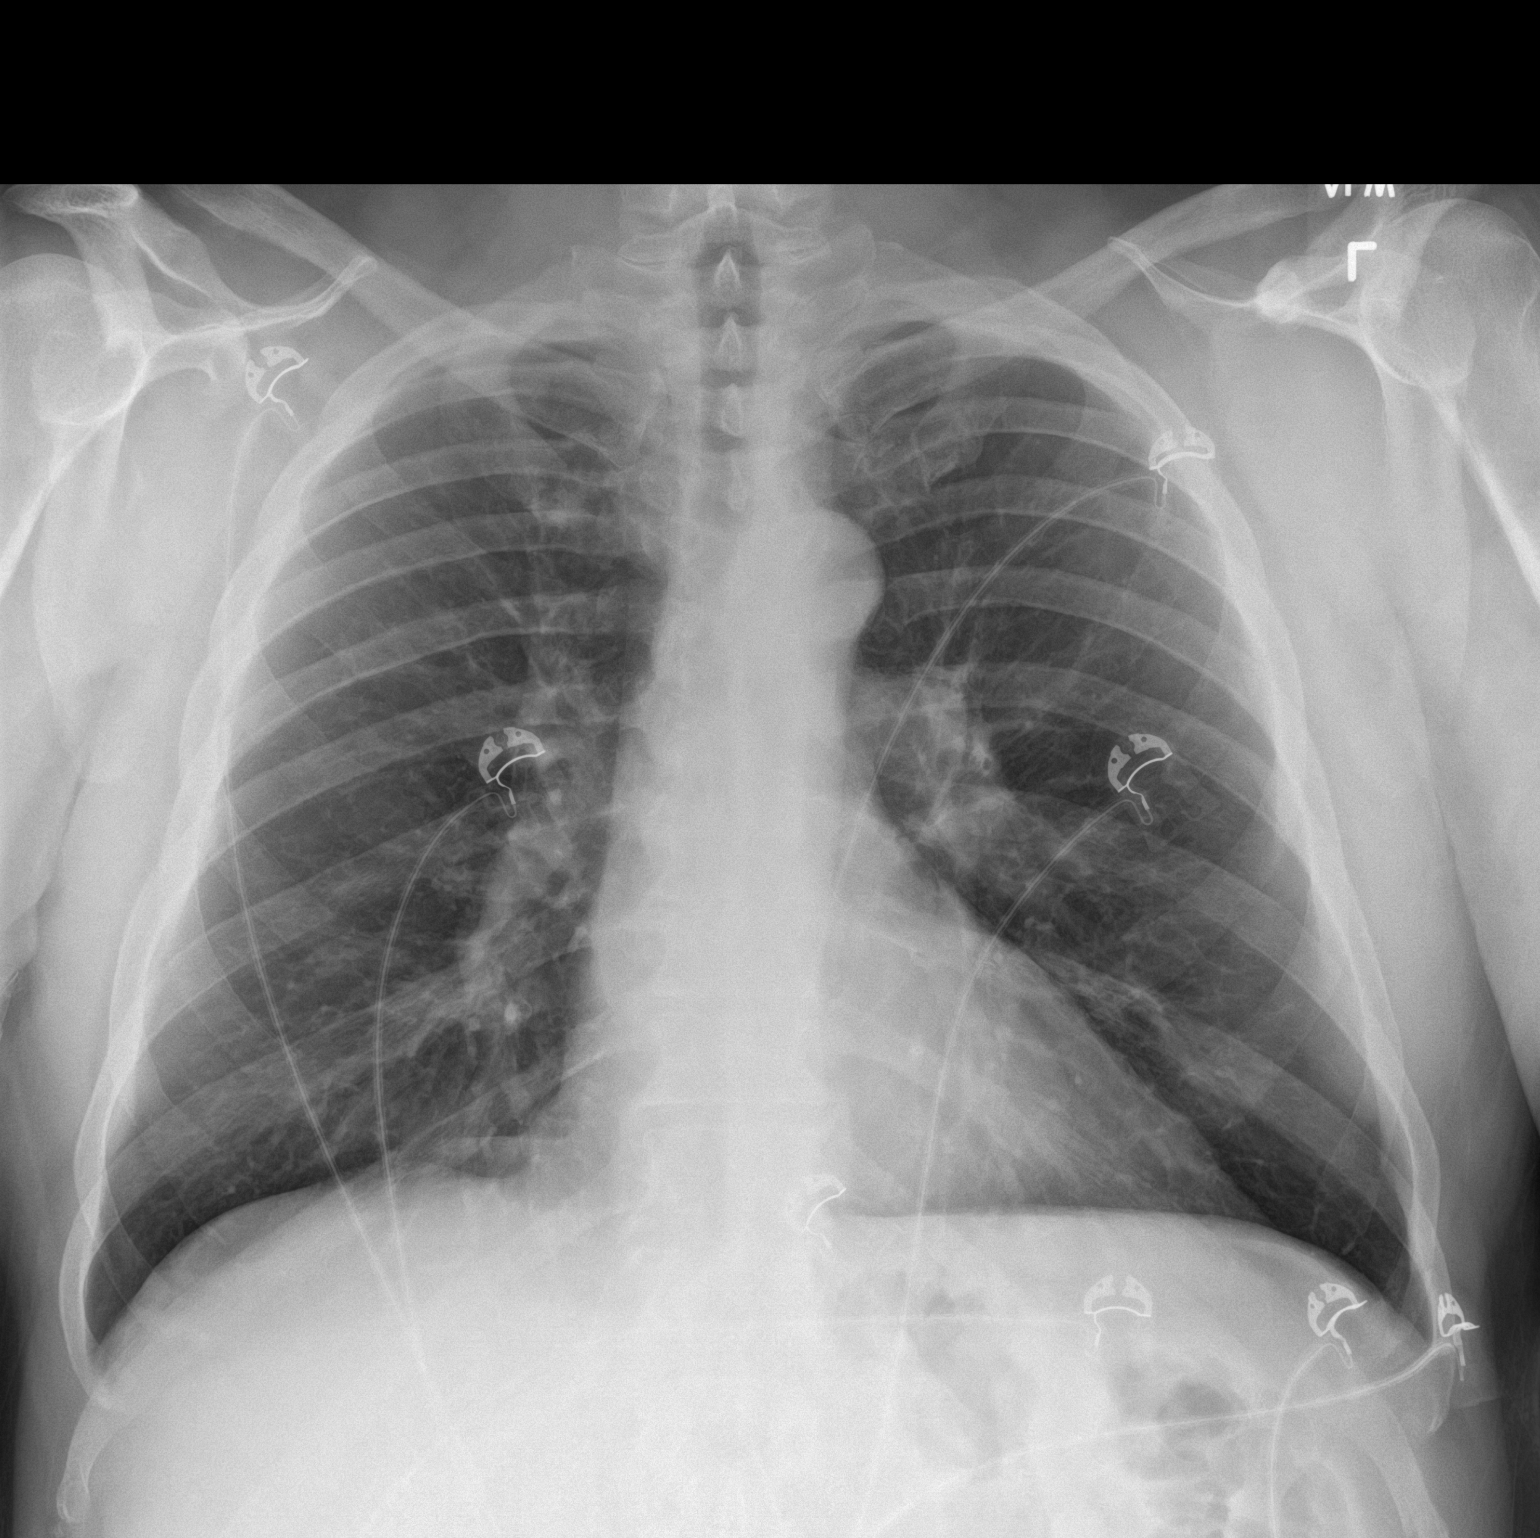

[chest lat]
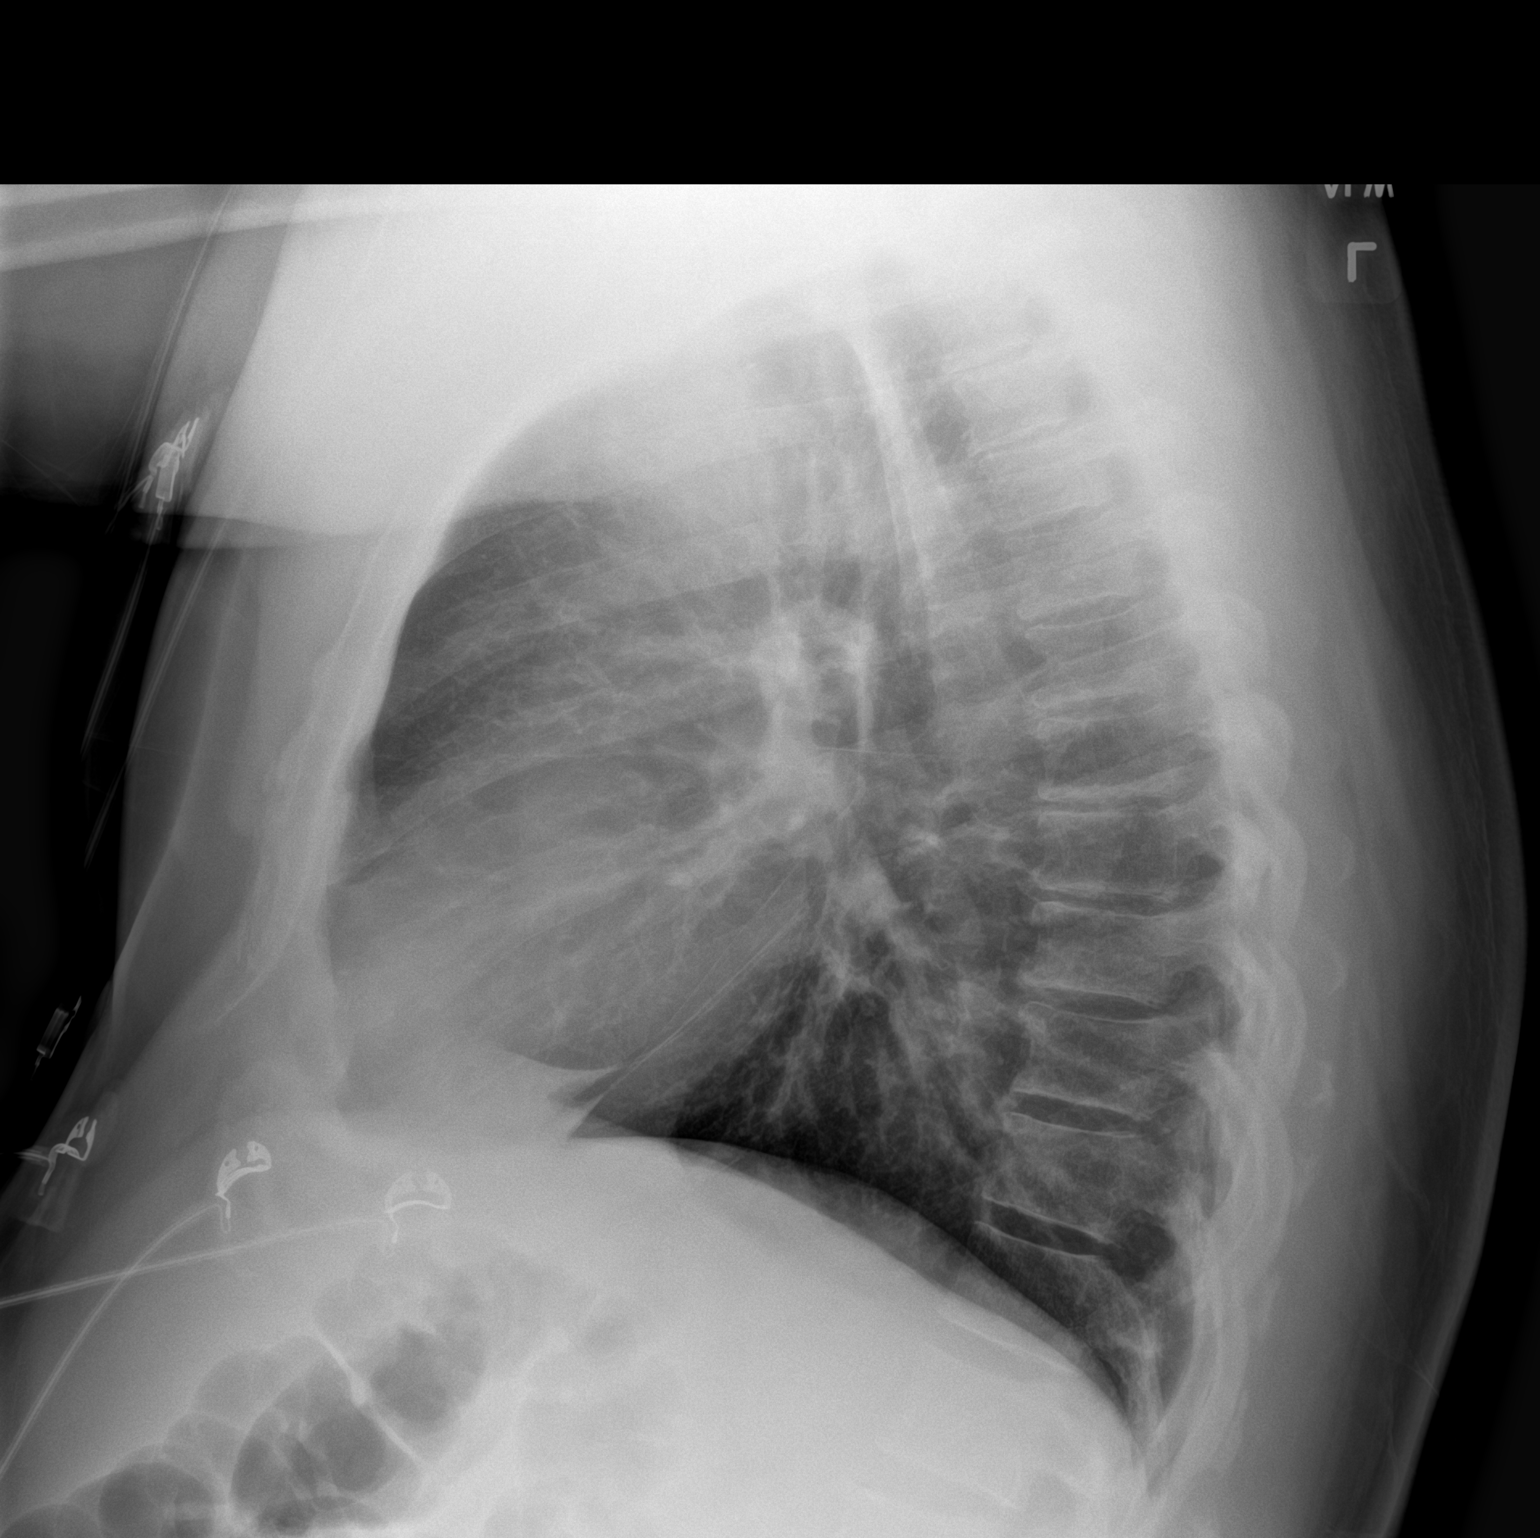

[2 of 2 positions shown; findings below may reference images not displayed]

FINDINGS: Normal heart size, mediastinal contours, and pulmonary vascularity.

Lungs clear.

No pulmonary infiltrate, pleural effusion, or pneumothorax.

Osseous structures unremarkable.
IMPRESSION: No acute abnormalities.

## 2024-03-14 ENCOUNTER — Emergency Department (HOSPITAL_BASED_OUTPATIENT_CLINIC_OR_DEPARTMENT_OTHER)
Admission: EM | Admit: 2024-03-14 | Discharge: 2024-03-15 | Disposition: A | Discharge status: Home / Self Care | Attending: Emergency Medicine | Admitting: Emergency Medicine

## 2024-03-14 ENCOUNTER — Other Ambulatory Visit: Payer: Self-pay

## 2024-03-14 ENCOUNTER — Encounter (HOSPITAL_BASED_OUTPATIENT_CLINIC_OR_DEPARTMENT_OTHER): Payer: Self-pay

## 2024-03-14 DIAGNOSIS — K047 Periapical abscess without sinus: Secondary | ICD-10-CM | POA: Insufficient documentation

## 2024-03-14 DIAGNOSIS — K029 Dental caries, unspecified: Secondary | ICD-10-CM | POA: Diagnosis not present

## 2024-03-14 DIAGNOSIS — R519 Headache, unspecified: Secondary | ICD-10-CM | POA: Diagnosis present

## 2024-03-14 MED ORDER — CLINDAMYCIN PHOSPHATE 600 MG/50ML IV SOLN
600.0000 mg | Freq: Once | INTRAVENOUS | Status: AC
  Administered 2024-03-14: 600 mg via INTRAVENOUS
  Filled 2024-03-14: qty 50

## 2024-03-14 MED ORDER — IBUPROFEN 800 MG PO TABS
800.0000 mg | ORAL_TABLET | Freq: Once | ORAL | Status: AC
  Administered 2024-03-14: 800 mg via ORAL
  Filled 2024-03-14: qty 1

## 2024-03-14 MED ORDER — CLINDAMYCIN HCL 300 MG PO CAPS: 300.0000 mg | ORAL_CAPSULE | Freq: Four times a day (QID) | ORAL | 0 refills | Status: AC

## 2024-03-14 MED ORDER — IBUPROFEN 800 MG PO TABS: 800.0000 mg | ORAL_TABLET | Freq: Three times a day (TID) | ORAL | 0 refills | Status: AC | PRN

## 2024-03-14 NOTE — ED Triage Notes (Signed)
 Facial pain for several days. Pt thought he was having a sinus infection.   Says sinus' cleared up and then left cheek pain manifested. Pain worse when touched and thought it may be in the skin.   Denies any bumps, or cuts to skin.

## 2024-03-14 NOTE — ED Provider Notes (Signed)
 [place] EMERGENCY DEPARTMENT AT [facility] Provider Note   CSN: [id number] Arrival date & time: 03/14/24  2001     History  Chief Complaint  Patient presents with   Facial Pain    Aaron Dixon is a 53 y.o. male.  Patient complains of an infection to the left side of his face.  Patient reports that he has some bad teeth.  He is planning on seeing a dentist in July.  Patient denies any fever or chills.  Patient has been taking Tylenol and ibuprofen.  Patient denies any diabetes.  The history is provided by the patient. No language interpreter was used.       Home Medications Prior to Admission medications   Medication Sig Start Date End Date Taking? Authorizing Provider  albuterol (VENTOLIN HFA) 108 (90 Base) MCG/ACT inhaler Inhale 1-2 puffs into the lungs every 6 (six) hours as needed for wheezing or shortness of breath. 03/21/22   Lorre Nick, MD  buprenorphine-naloxone (SUBOXONE) 2-0.5 mg SUBL SL tablet Place 1 tablet under the tongue daily as needed (opioid dependence).     [provider]  fluticasone (FLONASE) 50 MCG/ACT nasal spray Place 1 spray into both nostrils daily as needed for allergies or rhinitis (ear fullness). 03/29/20   Petrucelli, Samantha R, PA-C  ibuprofen (ADVIL,MOTRIN) 600 MG tablet Take 1 tablet (600 mg total) by mouth every 6 (six) hours as needed. 01/08/19   Elpidio Anis, PA-C  naproxen (NAPROSYN) 500 MG tablet Take 1 tablet (500 mg total) by mouth 2 (two) times daily. Patient not taking: Reported on 01/08/2019 02/28/18   Petrucelli, Pleas Koch, PA-C  neomycin-polymyxin-hydrocortisone (CORTISPORIN) 3.5-10000-1 OTIC suspension Place 4 drops into both ears 3 (three) times daily. 06/26/22   Gailen Shelter, PA  predniSONE (DELTASONE) 50 MG tablet Take 1 tablet by mouth once daily for 5 days 03/21/22   Lorre Nick, MD      Allergies    Patient has no known allergies.    Review of Systems   Review of Systems  All other systems  reviewed and are negative.   Physical Exam Updated Vital Signs BP (!) 161/91   Pulse 74   Temp 97.8 F (36.6 C)   Resp 17   Ht 5\' 9"  (1.753 m)   Wt 99.8 kg   SpO2 97%   BMI 32.49 kg/m  Physical Exam Vitals and nursing note reviewed.  Constitutional:      Appearance: He is well-developed.  HENT:     Head: Normocephalic.     Mouth/Throat:     Comments: Severe dental decay multiple broke teeth upper gum swelling left upper gum gum Cardiovascular:     Rate and Rhythm: Normal rate.  Pulmonary:     Effort: Pulmonary effort is normal.  Abdominal:     General: There is no distension.  Musculoskeletal:        General: Normal range of motion.  Skin:    General: Skin is warm.  Neurological:     General: No focal deficit present.     Mental Status: He is alert and oriented to person, place, and time.  Psychiatric:        Mood and Affect: Mood normal.     ED Results / Procedures / Treatments   Labs (all labs ordered are listed, but only abnormal results are displayed) Labs Reviewed - No data to display  EKG None  Radiology No results found.  Procedures Procedures    Medications Ordered in ED  Medications  ibuprofen (ADVIL) tablet 800 mg (has no administration in time range)    ED Course/ Medical Decision Making/ A&P                                 Medical Decision Making Patient complains of a dental infection  Risk Prescription drug management. Risk Details: Patient reports he has been taking a old prescription for amoxicillin.  He has been taking Tylenol and ibuprofen for pain.           Final Clinical Impression(s) / ED Diagnoses Final diagnoses:  Dental infection    Rx / DC Orders ED Discharge Orders          Ordered    clindamycin (CLEOCIN) 300 MG capsule  4 times daily        03/14/24 2315    ibuprofen (ADVIL) 800 MG tablet  Every 8 hours PRN        03/14/24 2315           An After Visit Summary was printed and given to the  patient.    Elson Areas, PA-C 03/14/24 2316    Glyn Ade, MD 03/15/24 [phone]

## 2024-03-14 NOTE — Discharge Instructions (Addendum)
Follow up with dentist for recheck
